# Patient Record
Sex: Female | Born: 1985
Health system: Southern US, Community
[De-identification: ages and names within clinical notes are randomized; demographics above are authoritative.]

## PROBLEM LIST (undated history)

## (undated) DIAGNOSIS — G43009 Migraine without aura, not intractable, without status migrainosus: Secondary | ICD-10-CM

## (undated) DIAGNOSIS — E119 Type 2 diabetes mellitus without complications: Secondary | ICD-10-CM

## (undated) DIAGNOSIS — A749 Chlamydial infection, unspecified: Secondary | ICD-10-CM

## (undated) DIAGNOSIS — J45909 Unspecified asthma, uncomplicated: Secondary | ICD-10-CM

## (undated) DIAGNOSIS — G43109 Migraine with aura, not intractable, without status migrainosus: Secondary | ICD-10-CM

## (undated) DIAGNOSIS — Z803 Family history of malignant neoplasm of breast: Secondary | ICD-10-CM

## (undated) DIAGNOSIS — J302 Other seasonal allergic rhinitis: Secondary | ICD-10-CM

## (undated) HISTORY — PX: OTHER SURGICAL HISTORY: SHX169

## (undated) HISTORY — DX: Migraine without aura, not intractable, without status migrainosus: G43.009

## (undated) HISTORY — DX: Migraine with aura, not intractable, without status migrainosus: G43.109

## (undated) HISTORY — DX: Chlamydial infection, unspecified: A74.9

## (undated) HISTORY — DX: Family history of malignant neoplasm of breast: Z80.3

---

## 2005-06-15 ENCOUNTER — Emergency Department: Payer: Self-pay | Admitting: Emergency Medicine

## 2005-09-24 ENCOUNTER — Emergency Department: Payer: Self-pay | Admitting: Internal Medicine

## 2005-09-26 ENCOUNTER — Ambulatory Visit: Payer: Self-pay | Admitting: Internal Medicine

## 2009-11-20 ENCOUNTER — Emergency Department: Payer: Self-pay | Admitting: Emergency Medicine

## 2010-03-19 ENCOUNTER — Inpatient Hospital Stay: Payer: Self-pay

## 2010-04-16 ENCOUNTER — Ambulatory Visit: Payer: Self-pay | Admitting: Family Medicine

## 2010-06-04 ENCOUNTER — Ambulatory Visit: Payer: Self-pay | Admitting: Internal Medicine

## 2011-11-04 ENCOUNTER — Emergency Department: Payer: Self-pay | Admitting: Emergency Medicine

## 2012-01-18 ENCOUNTER — Emergency Department: Payer: Self-pay | Admitting: Emergency Medicine

## 2012-05-03 ENCOUNTER — Observation Stay: Payer: Self-pay | Admitting: Internal Medicine

## 2012-05-03 LAB — COMPREHENSIVE METABOLIC PANEL
Alkaline Phosphatase: 113 U/L (ref 50–136)
Anion Gap: 12 (ref 7–16)
Bilirubin,Total: 0.5 mg/dL (ref 0.2–1.0)
Calcium, Total: 9.7 mg/dL (ref 8.5–10.1)
Chloride: 106 mmol/L (ref 98–107)
Creatinine: 0.97 mg/dL (ref 0.60–1.30)
EGFR (African American): >60
EGFR (Non-African Amer.): >60
Glucose: 180 mg/dL — ABNORMAL HIGH (ref 65–99)
Osmolality: 282 (ref 275–301)
Potassium: 3.8 mmol/L (ref 3.5–5.1)
SGOT(AST): 19 U/L (ref 15–37)
Total Protein: 8 g/dL (ref 6.4–8.2)

## 2012-05-03 LAB — CBC
HCT: 50.4 % — ABNORMAL HIGH (ref 35.0–47.0)
HGB: 16.9 g/dL — ABNORMAL HIGH (ref 12.0–16.0)
MCH: 28 pg (ref 26.0–34.0)
MCHC: 33.5 g/dL (ref 32.0–36.0)
MCV: 83 fL (ref 80–100)
Platelet: 501 10*3/uL — ABNORMAL HIGH (ref 150–440)
RBC: 6.04 10*6/uL — ABNORMAL HIGH (ref 3.80–5.20)
RDW: 14.3 % (ref 11.5–14.5)

## 2012-05-04 LAB — CBC WITH DIFFERENTIAL/PLATELET
Basophil #: 0.1 10*3/uL (ref 0.0–0.1)
Basophil %: 0.7 %
HCT: 40.2 % (ref 35.0–47.0)
MCH: 28.4 pg (ref 26.0–34.0)
MCHC: 34.2 g/dL (ref 32.0–36.0)
MCV: 83 fL (ref 80–100)
Monocyte %: 0.5 %
Neutrophil #: 8.7 10*3/uL — ABNORMAL HIGH (ref 1.4–6.5)
Neutrophil %: 89.6 %
Platelet: 292 10*3/uL (ref 150–440)
RBC: 4.85 10*6/uL (ref 3.80–5.20)
WBC: 9.7 10*3/uL (ref 3.6–11.0)

## 2012-05-04 LAB — BASIC METABOLIC PANEL
BUN: 13 mg/dL (ref 7–18)
Calcium, Total: 8.6 mg/dL (ref 8.5–10.1)
Chloride: 110 mmol/L — ABNORMAL HIGH (ref 98–107)
Co2: 21 mmol/L (ref 21–32)
Creatinine: 0.86 mg/dL (ref 0.60–1.30)
Osmolality: 280 (ref 275–301)
Potassium: 4.4 mmol/L (ref 3.5–5.1)
Sodium: 139 mmol/L (ref 136–145)

## 2012-05-04 LAB — CK-MB: CK-MB: <0.5 ng/mL — ABNORMAL LOW (ref 0.5–3.6)

## 2012-05-04 LAB — TROPONIN I: Troponin-I: <0.02 ng/mL

## 2012-06-23 ENCOUNTER — Emergency Department: Payer: Self-pay | Admitting: Emergency Medicine

## 2012-06-23 LAB — CBC
HCT: 47.1 % — ABNORMAL HIGH (ref 35.0–47.0)
MCH: 28.9 pg (ref 26.0–34.0)
MCHC: 34.6 g/dL (ref 32.0–36.0)
MCV: 84 fL (ref 80–100)
Platelet: 434 10*3/uL (ref 150–440)
RBC: 5.64 10*6/uL — ABNORMAL HIGH (ref 3.80–5.20)
RDW: 13.6 % (ref 11.5–14.5)

## 2012-06-23 LAB — COMPREHENSIVE METABOLIC PANEL
Albumin: 3.9 g/dL (ref 3.4–5.0)
Alkaline Phosphatase: 134 U/L (ref 50–136)
BUN: 14 mg/dL (ref 7–18)
Bilirubin,Total: 0.5 mg/dL (ref 0.2–1.0)
Chloride: 105 mmol/L (ref 98–107)
Co2: 28 mmol/L (ref 21–32)
Creatinine: 1.06 mg/dL (ref 0.60–1.30)
Glucose: 145 mg/dL — ABNORMAL HIGH (ref 65–99)
SGOT(AST): 22 U/L (ref 15–37)
Sodium: 139 mmol/L (ref 136–145)

## 2012-06-23 LAB — URINALYSIS, COMPLETE
Bilirubin,UR: NEGATIVE
Blood: NEGATIVE
Ketone: NEGATIVE
RBC,UR: 1 /HPF (ref 0–5)
Squamous Epithelial: 4
WBC UR: 3 /HPF (ref 0–5)

## 2012-06-23 LAB — LIPASE, BLOOD: Lipase: 226 U/L (ref 73–393)

## 2013-11-15 ENCOUNTER — Emergency Department: Payer: Self-pay | Admitting: Emergency Medicine

## 2014-01-17 ENCOUNTER — Ambulatory Visit: Payer: Self-pay | Admitting: Emergency Medicine

## 2014-07-02 NOTE — Discharge Summary (Signed)
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  She had allergies to cat dander in the past and then other 2 times she came in with allergic reactions, which were very, very mild with different substances.  She had an episode of migraine type of headache. She took Excedrin and Zofran. She also visited some store for farm products and later on she started having some itching on her skin and she took Children's Benadryl, but the itching worsened and so she came to the Emergency Room. In the ER, she was found having hypotensive and tachycardic but she was fully alert and oriented. She received Amanda [name] dose of Solu-Medrol and epinephrine by the ER and admitted for further work-up and observation for anaphylactic reaction.   HOSPITAL COURSE AND STAY:   She remained stable. She was continued on IV fluid.  Blood pressure, respirations and her symptoms remained totally stable. She did not have any further episodes of allergy reactions.  The next day morning she was feeling perfectly fine, back to her baseline and I asked her to have an EpiPen with her. She said that she already was given Amanda [name] prescription by her allergy specialist and she had an appointment by allergist specialist within Amanda [name] few weeks to get Amanda [name] full allergy work-up done. She is not filling up the prescription of epinephrine as there is Amanda [name] lot of co-pay for that, and so she manages her allergies with Benadryl orally.  As she was feeling perfectly fine, we discharged her home with advice to follow up with her allergy specialist doctor.  Other medical issues:  Elevated blood sugar on admission, but we checked her hemoglobin A1c which  was 4.6 so that might be due to injection of steroid or as she claims that she ate Amanda [name] lot of ice cream and chocolate on that day and may be that effect of that.    Nonspecific EKG changes.  Troponin was normal and there was no chest pain, so we did not do any further work-up about this and discharged her home.  LABORATORY AND DIAGNOSTIC DATA:  All of them are within normal range. No any significant abnormal lab results.   CONDITION ON DISCHARGE: Stable.   CODE STATUS: Full code.  MEDICATIONS ON DISCHARGE:  Ventolin 2 puffs inhaled every 6 hours as needed for shortness of breath and wheezing, phentermine 37.5 mg oral capsule once Amanda [name] day in the morning, Imitrex 50 mg oral tablet once Amanda [name] day as needed for migraine, ondansetron 8 mg oral tablet every 8 hours as needed for nausea and then Benaphen 50 mg oral infection. 3 times Amanda [name] day as needed for itching.   HOME HEALTH ON DISCHARGE: No.   HOME OXYGEN:  No.   DIET ON DISCHARGE: Regular. Diet consistency regular.   ACTIVITY LIMITATION:  As tolerated.   FOLLOW UP:  Timeframe to follow-up within 1 to 2 weeks with her allergy physician and follow his recommendations.   Total time spent on this discharge: 40 minutes     ____________________________ Hope PigeonVaibhavkumar G. Elisabeth PigeonVachhani, MD vgv:ct D: 05/05/2012 19:24:00 ET T: 05/06/2012 09:31:22 ET JOB#: [id number]  cc: Hope PigeonVaibhavkumar G. Elisabeth PigeonVachhani, MD, <Dictator> Altamese DillingVAIBHAVKUMAR Tahja Liao MD ELECTRONICALLY SIGNED  05/11/2012 23:05 

## 2014-07-02 NOTE — H&P (Signed)
PATIENT NAME:  Amanda [name], Amanda [name] MR#:  [id number] DATE OF BIRTH:  February 01, 1986  DATE OF ADMISSION:  05/03/2012  PRIMARY CARE PROVIDER:  [facility].   EMERGENCY DEPARTMENT REFERRING PHYSICIAN:  Dr. Maricela Bo.   CHIEF COMPLAINT:  Possible anaphylactic shock.   HISTORY OF PRESENT ILLNESS:  The patient is a 29 year old white female who reports that she has had a history of having mild allergic reactions in the past.  She reports the first time she had to come to the ED for cat dander allergic reaction with rash and itching, then she had to come 2 other times for other allergic reactions which were very mild.  She has been seen by an allergist and is planned to have a skin testing done.  The patient earlier today had a migraine.  So she took at 9:00 a.m. Excedrin Migraine.  Then, one hour later she was nauseous so she took Zofran.  She has taken both of these medications in the past without any problems.  And subsequently she went out to a store and when she was returning back she developed acute itching all over her body.  And so she came home and took Children's Benadryl.  She states that she took about 8 teaspoons and developed hives throughout what her body.  So came to the ED.  In the ER she was hypotensive and tachycardic.  Had to receive a dose of epinephrine and has received a dose of Solu-Medrol.  I am asked to admit the patient to monitor her overnight for further allergic reaction.  The patient reports that the itching is mostly resolved and the hives are gone.  She feels better.  She denies any chest pain or shortness of breath.  Denies any abdominal pain, nausea, vomiting or diarrhea.   PAST MEDICAL HISTORY:  History of allergies to she believes environmental allergies.  She also has a history of asthma for which she uses inhalers as needed.   PAST SURGICAL HISTORY:  History of having surgery in her ear as a child and a lymph node removal.   ALLERGIES:  CECLOR.    MEDICATIONS:  She is on Imitrex daily as needed for headache, Zofran 8 mg q. 8 as needed  for nausea, vomiting, phentermine 37.5 q. morning, Ventolin as needed.   SOCIAL HISTORY:  Does not smoke.  Does not drink.  No drugs.   FAMILY HISTORY:  Diabetes in uncle and grandparents.   REVIEW OF SYSTEMS: CONSTITUTIONAL:  Denies any fevers, chills.  No weakness.  No pain.  No weight loss.  No weight gain.  EYES:  No blurred or double vision.  No pain.  No redness.  No inflammation.  No glaucoma.  No cataracts.  EARS, NOSE, THROAT:  No tinnitus.  No ear pain.  No hearing loss.  Does have seasonal and year-round allergies.  No epistaxis.  No discharge.  No snoring.  No difficulty swallowing.  RESPIRATORY:  No cough, wheezing.  No hemoptysis.  No COPD.  No tuberculosis.  CARDIOVASCULAR:  Denies any chest pain, orthopnea or edema.  GASTROINTESTINAL:  No nausea, vomiting, diarrhea.  No abdominal pain.  No hematemesis.  No melena.  No changes in bowel habits.  GENITOURINARY:  Denies any dysuria, hematuria, renal calculus or frequency.  ENDOCRINE:  Denies any polyuria, nocturia or thyroid problems.  HEMATOLOGY AND LYMPHATIC:  Denies anemia, easy bruisability or bleeding.  SKIN:  No acne.  No rash.  No changes in mole, hair or  skin.  MUSCULOSKELETAL:  Denies any pain in neck, back or shoulder.  NEUROLOGIC:  No numbness.  No CVA.  No TIA.  No seizures.  PSYCHIATRIC:  No anxiety.  No insomnia.  No ADD.  No OCD.   PHYSICAL EXAMINATION: VITAL SIGNS:  Temperature 97.8, pulse initially was 126, respirations were 24, blood pressure was 68/48, currently heart rate is 91, respirations 20, blood pressure 123/67, O2 100%.  GENERAL:  The patient is an obese female currently not in any acute distress.  HEENT:  Head atraumatic, normocephalic.  Pupils equally round, reactive to light and accommodation.  There is no conjunctival pallor.  No scleral icterus.  Nasal exam shows no drainage or ulceration.   Oropharynx is  clear without any exudate.  NECK:  No thyromegaly.  No carotid bruits.  CARDIOVASCULAR:  Regular rate and rhythm.  No murmurs, rubs, clicks or gallops.  PMI is not displaced.  ABDOMEN:  Soft, nontender, nondistended.  Positive bowel sounds x 4.  EXTREMITIES:  No clubbing, cyanosis, edema.  SKIN:  No rash.  LYMPHATICS:  No lymph nodes palpable.  VASCULAR:  Good DP, PT pulses.  PSYCHIATRIC:  Not anxious or depressed.  NEUROLOGICAL:  Awake, alert, oriented x 3.  No focal deficits.   LABORATORY DATA:  BMP, glucose 180, BUN 13, creatinine 0.97, sodium 139, potassium 3.8, chloride 106, CO2 is 21, calcium is 9.7.  LFTs are normal.  WBC 11.7, hemoglobin 16.9, platelet count 501.  EKG shows Q-waves noted in inferior and nonspecific ST-T wave changes were noted.  ASSESSMENT AND PLAN:  The patient is a 29 year old white female with history of previous mild allergic reaction, also has asthma, presents with hives, hypotension.  1.  Anaphylactic shock, likely due to allergic reaction, possibly to the Excedrin or due to the Zofran.  At this time we will give her IV fluids.  Monitor her on telemetry.  Give her IV Solu-Medrol.  We will give her H2-blocker and Benadryl as needed.  2.  Elevated blood sugar.  We will check a hemoglobin A1c.  The patient may be diabetic.  3.  Nonspecific EKG changes.  We will check two sets of cardiac enzymes.     TIME SPENT:  35 minutes spent.     ____________________________ Lacie ScottsShreyang H. Allena KatzPatel, MD shp:ea D: 05/03/2012 21:34:18 ET T: 05/04/2012 00:38:21 ET JOB#: [id number]  cc: Toren Tucholski H. Allena KatzPatel, MD, <Dictator> Charise CarwinSHREYANG H Calvyn Kurtzman MD ELECTRONICALLY SIGNED 05/05/2012 12:25

## 2015-01-28 ENCOUNTER — Encounter: Payer: Self-pay | Admitting: Emergency Medicine

## 2015-01-28 ENCOUNTER — Ambulatory Visit
Admission: EM | Admit: 2015-01-28 | Discharge: 2015-01-28 | Disposition: A | Discharge status: Home / Self Care | Payer: BLUE CROSS/BLUE SHIELD | Attending: Family Medicine | Admitting: Family Medicine

## 2015-01-28 DIAGNOSIS — J069 Acute upper respiratory infection, unspecified: Secondary | ICD-10-CM | POA: Diagnosis not present

## 2015-01-28 HISTORY — DX: Unspecified asthma, uncomplicated: J45.909

## 2015-01-28 HISTORY — DX: Other seasonal allergic rhinitis: J30.2

## 2015-01-28 NOTE — ED Notes (Signed)
Patient c/o sore throat, nasal congestion, sinus pain and pressure, HAs, cough and chest congestion since Sunday.  Patient denies fevers.

## 2015-01-28 NOTE — Discharge Instructions (Signed)
Rest,  Increase fluids Tylenol or Ibuprofen as directed Mucinex as directed Upper Respiratory Infection, Adult Most upper respiratory infections (URIs) are a viral infection of the air passages leading to the lungs. A URI affects the nose, throat, and upper air passages. The most common type of URI is nasopharyngitis and is typically referred to as "the common cold." URIs run their course and usually go away on their own. Most of the time, a URI does not require medical attention, but sometimes a bacterial infection in the upper airways can follow a viral infection. This is called a secondary infection. Sinus and middle ear infections are common types of secondary upper respiratory infections. Bacterial pneumonia can also complicate a URI. A URI can worsen asthma and chronic obstructive pulmonary disease (COPD). Sometimes, these complications can require emergency medical care and may be life threatening.  CAUSES Almost all URIs are caused by viruses. A virus is a type of germ and can spread from one person to another.  RISKS FACTORS You may be at risk for a URI if:   You smoke.   You have chronic heart or lung disease.  You have a weakened defense (immune) system.   You are very young or very old.   You have nasal allergies or asthma.  You work in crowded or poorly ventilated areas.  You work in health care facilities or schools. SIGNS AND SYMPTOMS  Symptoms typically develop 2-3 days after you come in contact with a cold virus. Most viral URIs last 7-10 days. However, viral URIs from the influenza virus (flu virus) can last 14-18 days and are typically more severe. Symptoms may include:   Runny or stuffy (congested) nose.   Sneezing.   Cough.   Sore throat.   Headache.   Fatigue.   Fever.   Loss of appetite.   Pain in your forehead, behind your eyes, and over your cheekbones (sinus pain).  Muscle aches.  DIAGNOSIS  Your health care provider may diagnose a  URI by:  Physical exam.  Tests to check that your symptoms are not due to another condition such as:  Strep throat.  Sinusitis.  Pneumonia.  Asthma. TREATMENT  A URI goes away on its own with time. It cannot be cured with medicines, but medicines may be prescribed or recommended to relieve symptoms. Medicines may help:  Reduce your fever.  Reduce your cough.  Relieve nasal congestion. HOME CARE INSTRUCTIONS   Take medicines only as directed by your health care provider.   Gargle warm saltwater or take cough drops to comfort your throat as directed by your health care provider.  Use a warm mist humidifier or inhale steam from a shower to increase air moisture. This may make it easier to breathe.  Drink enough fluid to keep your urine clear or pale yellow.   Eat soups and other clear broths and maintain good nutrition.   Rest as needed.   Return to work when your temperature has returned to normal or as your health care provider advises. You may need to stay home longer to avoid infecting others. You can also use a face mask and careful hand washing to prevent spread of the virus.  Increase the usage of your inhaler if you have asthma.   Do not use any tobacco products, including cigarettes, chewing tobacco, or electronic cigarettes. If you need help quitting, ask your health care provider. PREVENTION  The best way to protect yourself from getting a cold is to practice good  hygiene.   Avoid oral or hand contact with people with cold symptoms.   Wash your hands often if contact occurs.  There is no clear evidence that vitamin C, vitamin E, echinacea, or exercise reduces the chance of developing a cold. However, it is always recommended to get plenty of rest, exercise, and practice good nutrition.  SEEK MEDICAL CARE IF:   You are getting worse rather than better.   Your symptoms are not controlled by medicine.   You have chills.  You have worsening  shortness of breath.  You have brown or red mucus.  You have yellow or brown nasal discharge.  You have pain in your face, especially when you bend forward.  You have a fever.  You have swollen neck glands.  You have pain while swallowing.  You have white areas in the back of your throat. SEEK IMMEDIATE MEDICAL CARE IF:   You have severe or persistent:  Headache.  Ear pain.  Sinus pain.  Chest pain.  You have chronic lung disease and any of the following:  Wheezing.  Prolonged cough.  Coughing up blood.  A change in your usual mucus.  You have a stiff neck.  You have changes in your:  Vision.  Hearing.  Thinking.  Mood. MAKE SURE YOU:   Understand these instructions.  Will watch your condition.  Will get help right away if you are not doing well or get worse.   This information is not intended to replace advice given to you by your health care provider. Make sure you discuss any questions you have with your health care provider.   Document Released: 08/22/2000 Document Revised: 07/13/2014 Document Reviewed: 06/03/2013 Elsevier Interactive Patient Education [employer]2016 [employer] Inc.

## 2015-01-28 NOTE — ED Provider Notes (Signed)
CSN: [id number]     Arrival date & time 01/28/15  0749 History   First MD Initiated Contact with Patient 01/28/15 [phone]     Chief Complaint  Patient presents with  . Sore Throat  . Cough  . Nasal Congestion   HPI  Amanda [name] is a pleasant 29 y.o. female with sore throat, nasal congestion, & headache.  She is coughing up green phlegm.  Denies SOB or chest pain.  Denies ill contacts.  Denies otalgia.  No fever.  + chills. Pain 2/10 now.  She has tried mucinex, tylenol cold & sinus, honey & hot tea.  Denies rash.  Tylenol helps with pain for a few hours.  Hx "allergic asthma" but no recent flares.  She does take singulair daily.  Past Medical History  Diagnosis Date  . Seasonal allergies   . Asthma, allergic    History reviewed. No pertinent past surgical history. History reviewed. No pertinent family history. Social History  Substance Use Topics  . Smoking status: Never Smoker   . Smokeless tobacco: Never Used  . Alcohol Use: 0.0 oz/week    0 Standard drinks or equivalent per week   OB History    No data available     Review of Systems  Constitutional: Negative.   HENT: Positive for congestion, postnasal drip, rhinorrhea, sinus pressure, sneezing and sore throat.   Eyes: Negative.   Respiratory: Positive for cough. Negative for chest tightness, shortness of breath and wheezing.   Cardiovascular: Negative.   Gastrointestinal: Negative.   Endocrine: Negative.   Genitourinary: Negative.   Musculoskeletal: Negative.   Skin: Negative.   Neurological: Negative.   Hematological: Negative.   Psychiatric/Behavioral: Negative.     Allergies  Qvar and Ceclor  Home Medications   Prior to Admission medications   Medication Sig Start Date End Date Taking? Authorizing Provider  acetaminophen (TYLENOL) 500 MG tablet Take 500 mg by mouth every 6 (six) hours as needed.   Yes Historical Provider, MD  fluticasone (FLONASE) 50 MCG/ACT nasal spray Place 2 sprays into both nostrils  daily.   Yes Historical Provider, MD  montelukast (SINGULAIR) 10 MG tablet Take 10 mg by mouth at bedtime.   Yes Historical Provider, MD  topiramate (TOPAMAX) 15 MG capsule Take 15 mg by mouth 2 (two) times daily.   Yes Historical Provider, MD   Meds Ordered and Administered this Visit  Medications - No data to display  BP 111/80 mmHg  Pulse 82  Temp(Src) 98 F (36.7 C) (Tympanic)  Resp 16  Ht 5\' 5"  (1.651 m)  Wt 214 lb (97.07 kg)  BMI 35.61 kg/m2  SpO2 99%  LMP 01/14/2015 (Approximate) No data found.   Physical Exam  Constitutional: She is oriented to person, place, and time. She appears well-developed and well-nourished. No distress.  HENT:  Head: Normocephalic and atraumatic.  Right Ear: Hearing, tympanic membrane, external ear and ear canal normal.  Left Ear: Hearing, tympanic membrane, external ear and ear canal normal.  Nose: Mucosal edema and rhinorrhea present. Right sinus exhibits no maxillary sinus tenderness and no frontal sinus tenderness. Left sinus exhibits no maxillary sinus tenderness and no frontal sinus tenderness.  Mouth/Throat: Uvula is midline, oropharynx is clear and moist and mucous membranes are normal.  Eyes: Conjunctivae are normal. No scleral icterus.  Neck: Normal range of motion.  Cardiovascular: Normal rate and regular rhythm.   Pulmonary/Chest: Effort normal and breath sounds normal. No respiratory distress.  Abdominal: Soft. Bowel sounds are normal. She  exhibits no distension.  Musculoskeletal: Normal range of motion. She exhibits no edema or tenderness.  Neurological: She is alert and oriented to person, place, and time. No cranial nerve deficit.  Skin: Skin is warm and dry. No rash noted. No erythema.  Psychiatric: Her behavior is normal. Judgment normal.  Nursing note and vitals reviewed.   ED Course  Procedures NA  MDM   1. URI (upper respiratory infection)    Patient reassured this is likely viral URI & symptoms should resolve within  2-3 weeks. Continue supportive measures. No role for antibiotics at this time.  Explained to patient this is likely viral URI. Cough and symptoms may persist for up to 3 weeks at times. Continue supportive measures. Reassured and discussed warning signs for patient to return to clinic including SOB, weakness, unrelenting fever, severe pain or worsening symptoms.  Patient agrees with plan  Plan: Diagnosis reviewed with patient Ibuprofen or tylenol PRN Mucinex 1-2 tabs BID PRN Continue singulair & resume Flonase  Recommend supportive treatment with rest,  Increased fluids Seek additional medical care if symptoms worsen or are not improving     Joselyn Arrow, NP 01/28/15 0840  Joselyn Arrow, NP 01/28/15 [phone]

## 2015-06-16 DIAGNOSIS — J301 Allergic rhinitis due to pollen: Secondary | ICD-10-CM | POA: Diagnosis not present

## 2015-06-22 DIAGNOSIS — J301 Allergic rhinitis due to pollen: Secondary | ICD-10-CM | POA: Diagnosis not present

## 2015-06-23 DIAGNOSIS — J301 Allergic rhinitis due to pollen: Secondary | ICD-10-CM | POA: Diagnosis not present

## 2015-06-30 DIAGNOSIS — J301 Allergic rhinitis due to pollen: Secondary | ICD-10-CM | POA: Diagnosis not present

## 2015-07-07 DIAGNOSIS — J301 Allergic rhinitis due to pollen: Secondary | ICD-10-CM | POA: Diagnosis not present

## 2015-07-14 DIAGNOSIS — J301 Allergic rhinitis due to pollen: Secondary | ICD-10-CM | POA: Diagnosis not present

## 2015-07-21 DIAGNOSIS — J301 Allergic rhinitis due to pollen: Secondary | ICD-10-CM | POA: Diagnosis not present

## 2015-07-28 DIAGNOSIS — J301 Allergic rhinitis due to pollen: Secondary | ICD-10-CM | POA: Diagnosis not present

## 2015-08-04 DIAGNOSIS — J301 Allergic rhinitis due to pollen: Secondary | ICD-10-CM | POA: Diagnosis not present

## 2015-08-11 DIAGNOSIS — J301 Allergic rhinitis due to pollen: Secondary | ICD-10-CM | POA: Diagnosis not present

## 2015-08-18 DIAGNOSIS — J301 Allergic rhinitis due to pollen: Secondary | ICD-10-CM | POA: Diagnosis not present

## 2015-08-25 DIAGNOSIS — J301 Allergic rhinitis due to pollen: Secondary | ICD-10-CM | POA: Diagnosis not present

## 2015-09-01 DIAGNOSIS — J301 Allergic rhinitis due to pollen: Secondary | ICD-10-CM | POA: Diagnosis not present

## 2015-09-08 DIAGNOSIS — J301 Allergic rhinitis due to pollen: Secondary | ICD-10-CM | POA: Diagnosis not present

## 2015-09-14 DIAGNOSIS — J301 Allergic rhinitis due to pollen: Secondary | ICD-10-CM | POA: Diagnosis not present

## 2015-09-22 DIAGNOSIS — J301 Allergic rhinitis due to pollen: Secondary | ICD-10-CM | POA: Diagnosis not present

## 2015-09-26 DIAGNOSIS — J301 Allergic rhinitis due to pollen: Secondary | ICD-10-CM | POA: Diagnosis not present

## 2015-10-06 DIAGNOSIS — J301 Allergic rhinitis due to pollen: Secondary | ICD-10-CM | POA: Diagnosis not present

## 2015-10-13 DIAGNOSIS — J301 Allergic rhinitis due to pollen: Secondary | ICD-10-CM | POA: Diagnosis not present

## 2015-10-20 DIAGNOSIS — J301 Allergic rhinitis due to pollen: Secondary | ICD-10-CM | POA: Diagnosis not present

## 2015-10-27 DIAGNOSIS — J301 Allergic rhinitis due to pollen: Secondary | ICD-10-CM | POA: Diagnosis not present

## 2015-11-01 DIAGNOSIS — J3089 Other allergic rhinitis: Secondary | ICD-10-CM | POA: Diagnosis not present

## 2015-11-01 DIAGNOSIS — Z713 Dietary counseling and surveillance: Secondary | ICD-10-CM | POA: Diagnosis not present

## 2015-11-01 DIAGNOSIS — G43709 Chronic migraine without aura, not intractable, without status migrainosus: Secondary | ICD-10-CM | POA: Diagnosis not present

## 2015-11-03 DIAGNOSIS — J301 Allergic rhinitis due to pollen: Secondary | ICD-10-CM | POA: Diagnosis not present

## 2015-11-10 DIAGNOSIS — J301 Allergic rhinitis due to pollen: Secondary | ICD-10-CM | POA: Diagnosis not present

## 2015-11-16 DIAGNOSIS — J301 Allergic rhinitis due to pollen: Secondary | ICD-10-CM | POA: Diagnosis not present

## 2015-12-01 DIAGNOSIS — J301 Allergic rhinitis due to pollen: Secondary | ICD-10-CM | POA: Diagnosis not present

## 2015-12-09 DIAGNOSIS — J301 Allergic rhinitis due to pollen: Secondary | ICD-10-CM | POA: Diagnosis not present

## 2015-12-09 DIAGNOSIS — Z0000 Encounter for general adult medical examination without abnormal findings: Secondary | ICD-10-CM | POA: Diagnosis not present

## 2015-12-09 DIAGNOSIS — R5383 Other fatigue: Secondary | ICD-10-CM | POA: Diagnosis not present

## 2015-12-09 DIAGNOSIS — Z13220 Encounter for screening for lipoid disorders: Secondary | ICD-10-CM | POA: Diagnosis not present

## 2015-12-09 DIAGNOSIS — Z131 Encounter for screening for diabetes mellitus: Secondary | ICD-10-CM | POA: Diagnosis not present

## 2015-12-15 DIAGNOSIS — J301 Allergic rhinitis due to pollen: Secondary | ICD-10-CM | POA: Diagnosis not present

## 2015-12-22 DIAGNOSIS — J301 Allergic rhinitis due to pollen: Secondary | ICD-10-CM | POA: Diagnosis not present

## 2015-12-29 DIAGNOSIS — J301 Allergic rhinitis due to pollen: Secondary | ICD-10-CM | POA: Diagnosis not present

## 2016-01-03 DIAGNOSIS — J301 Allergic rhinitis due to pollen: Secondary | ICD-10-CM | POA: Diagnosis not present

## 2016-01-09 DIAGNOSIS — Z713 Dietary counseling and surveillance: Secondary | ICD-10-CM | POA: Diagnosis not present

## 2016-01-09 DIAGNOSIS — J301 Allergic rhinitis due to pollen: Secondary | ICD-10-CM | POA: Diagnosis not present

## 2016-01-19 DIAGNOSIS — J301 Allergic rhinitis due to pollen: Secondary | ICD-10-CM | POA: Diagnosis not present

## 2016-01-26 DIAGNOSIS — J301 Allergic rhinitis due to pollen: Secondary | ICD-10-CM | POA: Diagnosis not present

## 2016-01-27 DIAGNOSIS — J301 Allergic rhinitis due to pollen: Secondary | ICD-10-CM | POA: Diagnosis not present

## 2016-01-27 DIAGNOSIS — H7200 Central perforation of tympanic membrane, unspecified ear: Secondary | ICD-10-CM | POA: Diagnosis not present

## 2016-01-31 DIAGNOSIS — J301 Allergic rhinitis due to pollen: Secondary | ICD-10-CM | POA: Diagnosis not present

## 2016-02-16 DIAGNOSIS — J301 Allergic rhinitis due to pollen: Secondary | ICD-10-CM | POA: Diagnosis not present

## 2016-02-23 DIAGNOSIS — J301 Allergic rhinitis due to pollen: Secondary | ICD-10-CM | POA: Diagnosis not present

## 2016-03-01 DIAGNOSIS — J301 Allergic rhinitis due to pollen: Secondary | ICD-10-CM | POA: Diagnosis not present

## 2016-03-16 DIAGNOSIS — J301 Allergic rhinitis due to pollen: Secondary | ICD-10-CM | POA: Diagnosis not present

## 2016-03-19 DIAGNOSIS — J301 Allergic rhinitis due to pollen: Secondary | ICD-10-CM | POA: Diagnosis not present

## 2016-03-22 DIAGNOSIS — J301 Allergic rhinitis due to pollen: Secondary | ICD-10-CM | POA: Diagnosis not present

## 2016-04-05 DIAGNOSIS — J301 Allergic rhinitis due to pollen: Secondary | ICD-10-CM | POA: Diagnosis not present

## 2016-04-12 DIAGNOSIS — J301 Allergic rhinitis due to pollen: Secondary | ICD-10-CM | POA: Diagnosis not present

## 2016-04-19 DIAGNOSIS — J301 Allergic rhinitis due to pollen: Secondary | ICD-10-CM | POA: Diagnosis not present

## 2016-04-26 DIAGNOSIS — J301 Allergic rhinitis due to pollen: Secondary | ICD-10-CM | POA: Diagnosis not present

## 2016-05-01 DIAGNOSIS — Z124 Encounter for screening for malignant neoplasm of cervix: Secondary | ICD-10-CM | POA: Diagnosis not present

## 2016-05-01 DIAGNOSIS — Z113 Encounter for screening for infections with a predominantly sexual mode of transmission: Secondary | ICD-10-CM | POA: Diagnosis not present

## 2016-05-01 DIAGNOSIS — Z01419 Encounter for gynecological examination (general) (routine) without abnormal findings: Secondary | ICD-10-CM | POA: Diagnosis not present

## 2016-05-01 DIAGNOSIS — Z1151 Encounter for screening for human papillomavirus (HPV): Secondary | ICD-10-CM | POA: Diagnosis not present

## 2016-05-03 DIAGNOSIS — J301 Allergic rhinitis due to pollen: Secondary | ICD-10-CM | POA: Diagnosis not present

## 2016-05-10 DIAGNOSIS — J301 Allergic rhinitis due to pollen: Secondary | ICD-10-CM | POA: Diagnosis not present

## 2016-05-17 DIAGNOSIS — J301 Allergic rhinitis due to pollen: Secondary | ICD-10-CM | POA: Diagnosis not present

## 2016-05-21 ENCOUNTER — Ambulatory Visit (INDEPENDENT_AMBULATORY_CARE_PROVIDER_SITE_OTHER): Payer: BLUE CROSS/BLUE SHIELD | Admitting: Obstetrics and Gynecology

## 2016-05-21 ENCOUNTER — Encounter: Payer: Self-pay | Admitting: Obstetrics and Gynecology

## 2016-05-21 VITALS — BP 130/82 | HR 81 | Ht 65.0 in | Wt 264.0 lb

## 2016-05-21 DIAGNOSIS — Z30430 Encounter for insertion of intrauterine contraceptive device: Secondary | ICD-10-CM

## 2016-05-21 MED ORDER — PARAGARD INTRAUTERINE COPPER IU IUD: 1.0000 [IU] | INTRAUTERINE_SYSTEM | Freq: Once | INTRAUTERINE | Status: AC

## 2016-05-21 NOTE — Progress Notes (Signed)
     IUD PROCEDURE NOTE:  Amanda [name] is a 31 y.o. No obstetric history on file. here for Paragard  IUD insertion. No GYN concerns.  Last pap smear was normal. LMP 05/17/16.  BP 130/82 (Patient Position: Sitting)   Pulse 81   Ht 5\' 5"  (1.651 m)   Wt 264 lb (119.7 kg)   LMP 05/17/2016 (Exact Date)   BMI 43.93 kg/m   IUD Insertion Procedure Note Patient identified, informed consent performed, consent signed.   Discussed risks of irregular bleeding, cramping, infection, malpositioning or misplacement of the IUD outside the uterus which may require further procedure such as laparoscopy, risk of failure <1%. Time out was performed.    A bimanual exam showed the uterus to be anteverted.  Speculum placed in the vagina.  Cervix visualized.  Cleaned with Betadine x 2.  Grasped anteriorly with a single tooth tenaculum.  Uterus sounded to 7.0 cm.   IUD placed per manufacturer's recommendations.  Strings trimmed to 3 cm. Tenaculum was removed, good hemostasis noted.  Patient tolerated procedure well.   ASSESSMENT: IUD insertion  Scheduled Meds: . PARAGARD INTRAUTERINE COPPER  1 Units Intrauterine Once    Plan:  Patient was given post-procedure instructions.  She was advised to have backup contraception for one week.   Call if you are having increasing pain, cramps or bleeding or if you have a fever greater than 100.4 degrees F., shaking chills, nausea or vomiting. Patient was also asked to check IUD strings periodically and follow up in 4 weeks for IUD check.  Return in about 4 weeks (around 06/18/2016) for IUD check.  Alicia B. Copland, PA-C 05/21/2016 10:41 AM

## 2016-05-24 DIAGNOSIS — J301 Allergic rhinitis due to pollen: Secondary | ICD-10-CM | POA: Diagnosis not present

## 2016-05-30 DIAGNOSIS — J301 Allergic rhinitis due to pollen: Secondary | ICD-10-CM | POA: Diagnosis not present

## 2016-06-14 DIAGNOSIS — Z1281 Encounter for screening for malignant neoplasm of oral cavity: Secondary | ICD-10-CM | POA: Diagnosis not present

## 2016-06-14 DIAGNOSIS — K033 Pathological resorption of teeth: Secondary | ICD-10-CM | POA: Diagnosis not present

## 2016-06-14 DIAGNOSIS — K0500 Acute gingivitis, plaque induced: Secondary | ICD-10-CM | POA: Diagnosis not present

## 2016-06-14 DIAGNOSIS — J329 Chronic sinusitis, unspecified: Secondary | ICD-10-CM | POA: Diagnosis not present

## 2016-06-15 DIAGNOSIS — J301 Allergic rhinitis due to pollen: Secondary | ICD-10-CM | POA: Diagnosis not present

## 2016-06-18 ENCOUNTER — Ambulatory Visit (INDEPENDENT_AMBULATORY_CARE_PROVIDER_SITE_OTHER): Payer: BLUE CROSS/BLUE SHIELD | Admitting: Obstetrics and Gynecology

## 2016-06-18 ENCOUNTER — Encounter: Payer: Self-pay | Admitting: Obstetrics and Gynecology

## 2016-06-18 VITALS — BP 116/72 | HR 100 | Ht 65.0 in | Wt 267.0 lb

## 2016-06-18 DIAGNOSIS — Z30431 Encounter for routine checking of intrauterine contraceptive device: Secondary | ICD-10-CM | POA: Diagnosis not present

## 2016-06-18 DIAGNOSIS — J301 Allergic rhinitis due to pollen: Secondary | ICD-10-CM | POA: Diagnosis not present

## 2016-06-18 NOTE — Progress Notes (Signed)
    History of Present Illness:  Amanda [name] is a 31 y.o. that had a Paragard IUD placed approximately 1 month ago. Since that time, she deneis pelvic pain, non-menstrual bleeding, vaginal d/c. Her LMP was heavier and with increased cramping but tolerable. She is happy with the IUD so far.   Review of Systems  Constitutional: Negative for fever, malaise/fatigue and weight loss.  Gastrointestinal: Negative for blood in stool, constipation, diarrhea, nausea and vomiting.  Genitourinary: Negative for dysuria, flank pain, frequency, hematuria and urgency.  Musculoskeletal: Negative for back pain.  Skin: Negative for itching and rash.    Physical Exam:  BP 116/72 (BP Location: Left Arm, Patient Position: Sitting, Cuff Size: Large)   Pulse 100   Ht  (1.651 m)   Wt 267 lb (121.1 kg)   LMP 06/13/2016   BMI 44.43 kg/m  Body mass index is 44.43 kg/m.  Pelvic exam:  Two IUD strings present seen coming from the cervical os. EGBUS, vaginal vault and cervix: within normal limits   Assessment:  Encounter for routine checking of intrauterine contraceptive device (IUD)   IUD strings present in proper location; pt doing well  Plan: F/u if any signs of infection or can no longer feel the strings.   Alicia B. Copland, PA-C 06/18/2016 4:07 PM

## 2016-06-21 DIAGNOSIS — J301 Allergic rhinitis due to pollen: Secondary | ICD-10-CM | POA: Diagnosis not present

## 2016-06-28 DIAGNOSIS — J301 Allergic rhinitis due to pollen: Secondary | ICD-10-CM | POA: Diagnosis not present

## 2016-07-05 DIAGNOSIS — J301 Allergic rhinitis due to pollen: Secondary | ICD-10-CM | POA: Diagnosis not present

## 2016-07-12 DIAGNOSIS — J301 Allergic rhinitis due to pollen: Secondary | ICD-10-CM | POA: Diagnosis not present

## 2016-07-16 DIAGNOSIS — Z79899 Other long term (current) drug therapy: Secondary | ICD-10-CM | POA: Diagnosis not present

## 2016-07-16 DIAGNOSIS — J45909 Unspecified asthma, uncomplicated: Secondary | ICD-10-CM | POA: Insufficient documentation

## 2016-07-16 DIAGNOSIS — L299 Pruritus, unspecified: Secondary | ICD-10-CM | POA: Diagnosis present

## 2016-07-16 DIAGNOSIS — T7840XA Allergy, unspecified, initial encounter: Secondary | ICD-10-CM | POA: Diagnosis not present

## 2016-07-16 MED ORDER — FAMOTIDINE 20 MG PO TABS
ORAL_TABLET | ORAL | Status: AC
  Administered 2016-07-16: 20 mg via ORAL
  Filled 2016-07-16: qty 1

## 2016-07-16 MED ORDER — PREDNISONE 20 MG PO TABS
ORAL_TABLET | ORAL | Status: AC
  Administered 2016-07-16: 60 mg via ORAL
  Filled 2016-07-16: qty 3

## 2016-07-16 MED ORDER — DIPHENHYDRAMINE HCL 25 MG PO CAPS
50.0000 mg | ORAL_CAPSULE | Freq: Once | ORAL | Status: AC
  Administered 2016-07-16: 50 mg via ORAL

## 2016-07-16 MED ORDER — PREDNISONE 20 MG PO TABS
60.0000 mg | ORAL_TABLET | Freq: Once | ORAL | Status: AC
  Administered 2016-07-16: 60 mg via ORAL

## 2016-07-16 MED ORDER — FAMOTIDINE 20 MG PO TABS
20.0000 mg | ORAL_TABLET | Freq: Once | ORAL | Status: AC
  Administered 2016-07-16: 20 mg via ORAL

## 2016-07-16 MED ORDER — DIPHENHYDRAMINE HCL 25 MG PO CAPS
ORAL_CAPSULE | ORAL | Status: AC
  Administered 2016-07-16: 50 mg via ORAL
  Filled 2016-07-16: qty 2

## 2016-07-16 NOTE — ED Triage Notes (Signed)
Pt unsure of cause states "I am allergic to everything outdoors". States has itching all over, denies any shob or swelling to mouth or throat. Pt gets allergy shots weekly, unsure of cause.

## 2016-07-17 ENCOUNTER — Emergency Department
Admission: EM | Admit: 2016-07-17 | Discharge: 2016-07-17 | Disposition: A | Discharge status: Home / Self Care | Payer: BLUE CROSS/BLUE SHIELD | Attending: Emergency Medicine | Admitting: Emergency Medicine

## 2016-07-17 DIAGNOSIS — T7840XA Allergy, unspecified, initial encounter: Secondary | ICD-10-CM

## 2016-07-17 MED ORDER — EPINEPHRINE 0.3 MG/0.3ML IJ SOAJ: 0.3000 mg | Freq: Once | INTRAMUSCULAR | 0 refills | Status: AC

## 2016-07-17 NOTE — ED Provider Notes (Signed)
[facility] Emergency Department Provider Note _   First MD Initiated Contact with Patient 07/17/16 [phone]     (approximate)  I have reviewed the triage vital signs and the nursing notes.   HISTORY  Chief Complaint Allergic Reaction    HPI Amanda [name] is a 31 y.o. female with history of multiple allergic reactions to multiple allergens presents to the emergency department with acute onset of generalized itching and hives before presentation. Patient denies any difficulty swallowing or breathing. Patient denies any stiff swelling of the mouth or throat.   Past Medical History:  Diagnosis Date  . Asthma, allergic   . Seasonal allergies     There are no active problems to display for this patient.   Past Surgical History:  Procedure Laterality Date  . Tubes in ear      Prior to Admission medications   Medication Sig Start Date End Date Taking? Authorizing Provider  acetaminophen (TYLENOL) 500 MG tablet Take 500 mg by mouth every 6 (six) hours as needed.    [provider]  fluticasone (FLONASE) 50 MCG/ACT nasal spray Place 2 sprays into both nostrils daily.    [provider]  montelukast (SINGULAIR) 10 MG tablet Take 10 mg by mouth at bedtime.    [provider]  topiramate (TOPAMAX) 15 MG capsule Take 15 mg by mouth 2 (two) times daily.    [provider]  VENTOLIN HFA 108 [phone] Base) MCG/ACT inhaler  04/27/16   [provider]    Allergies Charletta Cousin tar oil  [betula alba oil]; Qvar [beclomethasone]; and Ceclor [cefaclor]  Family History  Problem Relation Age of Onset  . Diabetes Maternal Uncle   . Breast cancer Paternal Aunt   . Diabetes Paternal Aunt   . Diabetes Maternal Grandfather     Social History Social History  Substance Use Topics  . Smoking status: Never Smoker  . Smokeless tobacco: Never Used  . Alcohol use 0.0 oz/week    Review of Systems Constitutional: No fever/chills Eyes:  No visual changes. ENT: No sore throat. Cardiovascular: Denies chest pain. Respiratory: Denies shortness of breath. Gastrointestinal: No abdominal pain.  No nausea, no vomiting.  No diarrhea.  No constipation. Genitourinary: Negative for dysuria. Musculoskeletal: Negative for back pain. Integumentary: Positive for pruritic rash Neurological: Negative for headaches, focal weakness or numbness.   ____________________________________________   PHYSICAL EXAM:  VITAL SIGNS: ED Triage Vitals  Enc Vitals Group     BP 07/16/16 2331 125/83     Pulse Rate 07/16/16 2331 (!) 137     Resp 07/16/16 2331 18     Temp 07/16/16 2331 98.5 F (36.9 C)     Temp Source 07/16/16 2331 Oral     SpO2 07/16/16 2331 97 %     Weight 07/16/16 2333 260 lb (117.9 kg)     Height 07/16/16 2333 5\' 5"  (1.651 m)     Head Circumference --      Peak Flow --      Pain Score --      Pain Loc --      Pain Edu? --      Excl. in GC? --     Constitutional: Alert and oriented. Well appearing and in no acute distress. Eyes: Conjunctivae are normal. PERRL. EOMI. Head: Atraumatic. Mouth/Throat: Mucous membranes are moist.  Oropharynx non-erythematous. Neck: No stridor.  Cardiovascular: Normal rate, regular rhythm. Good peripheral circulation. Grossly normal heart sounds. Respiratory: Normal respiratory effort.  No retractions.  Lungs CTAB. Gastrointestinal: Soft and nontender. No distention.  Musculoskeletal: No lower extremity tenderness nor edema. No gross deformities of extremities. Neurologic:  Normal speech and language. No gross focal neurologic deficits are appreciated.  Skin:  Skin is warm, dry and intact. Positive for hives Psychiatric: Mood and affect are normal. Speech and behavior are normal.     Procedures   ____________________________________________   INITIAL IMPRESSION / ASSESSMENT AND PLAN / ED COURSE  Pertinent labs & imaging results that were available during my care of the patient were  reviewed by me and considered in my medical decision making (see chart for details).  Patient given Benadryl prednisone and Pepcid with complete resolution of pruritic rash.     ____________________________________________  FINAL CLINICAL IMPRESSION(S) / ED DIAGNOSES  Final diagnoses:  Allergic reaction, initial encounter     MEDICATIONS GIVEN DURING THIS VISIT:  Medications  diphenhydrAMINE (BENADRYL) capsule 50 mg (50 mg Oral Given 07/16/16 2343)  predniSONE (DELTASONE) tablet 60 mg (60 mg Oral Given 07/16/16 2344)  famotidine (PEPCID) tablet 20 mg (20 mg Oral Given 07/16/16 2344)     NEW OUTPATIENT MEDICATIONS STARTED DURING THIS VISIT:  New Prescriptions   No medications on file    Modified Medications   No medications on file    Discontinued Medications   No medications on file     Note:  This document was prepared using Dragon voice recognition software and may include unintentional dictation errors.    Amanda [name], [place] N, MD 07/17/16 [phone]

## 2016-07-17 NOTE — ED Notes (Signed)
ED Provider at bedside. 

## 2016-07-19 DIAGNOSIS — J301 Allergic rhinitis due to pollen: Secondary | ICD-10-CM | POA: Diagnosis not present

## 2016-07-19 DIAGNOSIS — J305 Allergic rhinitis due to food: Secondary | ICD-10-CM | POA: Diagnosis not present

## 2016-07-19 DIAGNOSIS — Z91018 Allergy to other foods: Secondary | ICD-10-CM | POA: Diagnosis not present

## 2016-07-26 DIAGNOSIS — J301 Allergic rhinitis due to pollen: Secondary | ICD-10-CM | POA: Diagnosis not present

## 2016-08-02 DIAGNOSIS — J301 Allergic rhinitis due to pollen: Secondary | ICD-10-CM | POA: Diagnosis not present

## 2016-08-16 DIAGNOSIS — J301 Allergic rhinitis due to pollen: Secondary | ICD-10-CM | POA: Diagnosis not present

## 2016-08-23 DIAGNOSIS — J301 Allergic rhinitis due to pollen: Secondary | ICD-10-CM | POA: Diagnosis not present

## 2016-08-28 DIAGNOSIS — J301 Allergic rhinitis due to pollen: Secondary | ICD-10-CM | POA: Diagnosis not present

## 2016-09-06 DIAGNOSIS — J301 Allergic rhinitis due to pollen: Secondary | ICD-10-CM | POA: Diagnosis not present

## 2016-09-13 DIAGNOSIS — J301 Allergic rhinitis due to pollen: Secondary | ICD-10-CM | POA: Diagnosis not present

## 2016-09-17 DIAGNOSIS — J301 Allergic rhinitis due to pollen: Secondary | ICD-10-CM | POA: Diagnosis not present

## 2016-09-18 DIAGNOSIS — J301 Allergic rhinitis due to pollen: Secondary | ICD-10-CM | POA: Diagnosis not present

## 2016-09-27 DIAGNOSIS — J301 Allergic rhinitis due to pollen: Secondary | ICD-10-CM | POA: Diagnosis not present

## 2016-10-04 DIAGNOSIS — J301 Allergic rhinitis due to pollen: Secondary | ICD-10-CM | POA: Diagnosis not present

## 2016-10-10 DIAGNOSIS — J301 Allergic rhinitis due to pollen: Secondary | ICD-10-CM | POA: Diagnosis not present

## 2016-10-18 DIAGNOSIS — J301 Allergic rhinitis due to pollen: Secondary | ICD-10-CM | POA: Diagnosis not present

## 2016-10-25 DIAGNOSIS — J301 Allergic rhinitis due to pollen: Secondary | ICD-10-CM | POA: Diagnosis not present

## 2016-11-08 DIAGNOSIS — J301 Allergic rhinitis due to pollen: Secondary | ICD-10-CM | POA: Diagnosis not present

## 2016-11-15 DIAGNOSIS — J301 Allergic rhinitis due to pollen: Secondary | ICD-10-CM | POA: Diagnosis not present

## 2016-11-22 DIAGNOSIS — J301 Allergic rhinitis due to pollen: Secondary | ICD-10-CM | POA: Diagnosis not present

## 2016-11-29 DIAGNOSIS — J301 Allergic rhinitis due to pollen: Secondary | ICD-10-CM | POA: Diagnosis not present

## 2016-12-06 DIAGNOSIS — J301 Allergic rhinitis due to pollen: Secondary | ICD-10-CM | POA: Diagnosis not present

## 2016-12-13 DIAGNOSIS — J301 Allergic rhinitis due to pollen: Secondary | ICD-10-CM | POA: Diagnosis not present

## 2016-12-20 DIAGNOSIS — J301 Allergic rhinitis due to pollen: Secondary | ICD-10-CM | POA: Diagnosis not present

## 2017-01-03 DIAGNOSIS — J301 Allergic rhinitis due to pollen: Secondary | ICD-10-CM | POA: Diagnosis not present

## 2017-01-10 DIAGNOSIS — J301 Allergic rhinitis due to pollen: Secondary | ICD-10-CM | POA: Diagnosis not present

## 2017-01-17 DIAGNOSIS — J301 Allergic rhinitis due to pollen: Secondary | ICD-10-CM | POA: Diagnosis not present

## 2017-01-24 DIAGNOSIS — J301 Allergic rhinitis due to pollen: Secondary | ICD-10-CM | POA: Diagnosis not present

## 2017-01-29 DIAGNOSIS — J301 Allergic rhinitis due to pollen: Secondary | ICD-10-CM | POA: Diagnosis not present

## 2017-02-14 DIAGNOSIS — J301 Allergic rhinitis due to pollen: Secondary | ICD-10-CM | POA: Diagnosis not present

## 2017-02-28 DIAGNOSIS — J301 Allergic rhinitis due to pollen: Secondary | ICD-10-CM | POA: Diagnosis not present

## 2017-03-06 DIAGNOSIS — J301 Allergic rhinitis due to pollen: Secondary | ICD-10-CM | POA: Diagnosis not present

## 2017-03-14 DIAGNOSIS — J301 Allergic rhinitis due to pollen: Secondary | ICD-10-CM | POA: Diagnosis not present

## 2017-03-21 DIAGNOSIS — J301 Allergic rhinitis due to pollen: Secondary | ICD-10-CM | POA: Diagnosis not present

## 2017-03-28 DIAGNOSIS — J301 Allergic rhinitis due to pollen: Secondary | ICD-10-CM | POA: Diagnosis not present

## 2017-04-04 DIAGNOSIS — J301 Allergic rhinitis due to pollen: Secondary | ICD-10-CM | POA: Diagnosis not present

## 2017-04-11 DIAGNOSIS — J301 Allergic rhinitis due to pollen: Secondary | ICD-10-CM | POA: Diagnosis not present

## 2017-04-18 DIAGNOSIS — J301 Allergic rhinitis due to pollen: Secondary | ICD-10-CM | POA: Diagnosis not present

## 2017-04-25 DIAGNOSIS — J301 Allergic rhinitis due to pollen: Secondary | ICD-10-CM | POA: Diagnosis not present

## 2017-05-02 DIAGNOSIS — J301 Allergic rhinitis due to pollen: Secondary | ICD-10-CM | POA: Diagnosis not present

## 2017-05-09 DIAGNOSIS — J301 Allergic rhinitis due to pollen: Secondary | ICD-10-CM | POA: Diagnosis not present

## 2017-05-16 DIAGNOSIS — J301 Allergic rhinitis due to pollen: Secondary | ICD-10-CM | POA: Diagnosis not present

## 2017-05-19 ENCOUNTER — Other Ambulatory Visit: Payer: Self-pay

## 2017-05-19 ENCOUNTER — Ambulatory Visit
Admission: EM | Admit: 2017-05-19 | Discharge: 2017-05-19 | Disposition: A | Discharge status: Home / Self Care | Payer: BLUE CROSS/BLUE SHIELD | Attending: Emergency Medicine | Admitting: Emergency Medicine

## 2017-05-19 DIAGNOSIS — R5383 Other fatigue: Secondary | ICD-10-CM

## 2017-05-19 DIAGNOSIS — M791 Myalgia, unspecified site: Secondary | ICD-10-CM | POA: Diagnosis not present

## 2017-05-19 DIAGNOSIS — J029 Acute pharyngitis, unspecified: Secondary | ICD-10-CM | POA: Diagnosis not present

## 2017-05-19 DIAGNOSIS — J020 Streptococcal pharyngitis: Secondary | ICD-10-CM | POA: Diagnosis not present

## 2017-05-19 LAB — RAPID STREP SCREEN (MED CTR MEBANE ONLY): STREPTOCOCCUS, GROUP A SCREEN (DIRECT): POSITIVE — AB

## 2017-05-19 LAB — RAPID INFLUENZA A&B ANTIGENS
Influenza A (ARMC): NEGATIVE
Influenza B (ARMC): NEGATIVE

## 2017-05-19 MED ORDER — AMOXICILLIN 875 MG PO TABS: 875.0000 mg | ORAL_TABLET | Freq: Two times a day (BID) | ORAL | 0 refills | Status: DC

## 2017-05-19 MED ORDER — PENICILLIN G BENZATHINE 1200000 UNIT/2ML IM SUSP: 1.2000 10*6.[IU] | Freq: Once | INTRAMUSCULAR | Status: DC

## 2017-05-19 NOTE — ED Provider Notes (Signed)
[facility]    CSN: [id number] Arrival date & time: 05/19/17  0801     History   Chief Complaint Chief Complaint  Patient presents with  . Sore Throat    HPI Amanda [name] is a 32 y.o. female.   HPI  Ms. Koerner presents with sore throat body aches and chills that she has had since Friday 2 days prior to this visit.  She also states that she had sinus congestion for a couple weeks but not nearly as bad as what has happened since Friday.  Her temperature at home has been 101.  Today it is 24 but she has taken medications prior to the visit today.  Pulse rate is 119 O2 sats are 99% on room air. She did receive her flu shot earlier in the year       Past Medical History:  Diagnosis Date  . Asthma, allergic   . Seasonal allergies     There are no active problems to display for this patient.   Past Surgical History:  Procedure Laterality Date  . Tubes in ear      OB History    Gravida Para Term Preterm AB Living   2 1 1   1 1    SAB TAB Ectopic Multiple Live Births   1       1       Home Medications    Prior to Admission medications   Medication Sig Start Date End Date Taking? Authorizing Provider  acetaminophen (TYLENOL) 500 MG tablet Take 500 mg by mouth every 6 (six) hours as needed.   Yes [provider]  fluticasone (FLONASE) 50 MCG/ACT nasal spray Place 2 sprays into both nostrils daily.   Yes [provider]  montelukast (SINGULAIR) 10 MG tablet Take 10 mg by mouth at bedtime.   Yes [provider]  topiramate (TOPAMAX) 15 MG capsule Take 15 mg by mouth 2 (two) times daily.   Yes [provider]  VENTOLIN HFA 108 (90 Base) MCG/ACT inhaler  04/27/16  Yes [provider]  amoxicillin (AMOXIL) 875 MG tablet Take 1 tablet (875 mg total) by mouth 2 (two) times daily. 05/19/17   Lutricia Feil, PA-C    Family History Family History  Problem Relation Age of Onset  . Diabetes Maternal Uncle   . Breast  cancer Paternal Aunt   . Diabetes Paternal Aunt   . Diabetes Maternal Grandfather     Social History Social History   Tobacco Use  . Smoking status: Never Smoker  . Smokeless tobacco: Never Used  Substance Use Topics  . Alcohol use: Yes    Alcohol/week: 0.0 oz  . Drug use: No     Allergies   Birch tar oil  [betula alba oil]; Qvar [beclomethasone]; and Ceclor [cefaclor]   Review of Systems Review of Systems  Constitutional: Positive for activity change, chills, fatigue and fever. Negative for appetite change.  HENT: Positive for congestion and sore throat.   Respiratory: Positive for cough.   All other systems reviewed and are negative.    Physical Exam Triage Vital Signs ED Triage Vitals  Enc Vitals Group     BP 05/19/17 0814 (!) 148/98     Pulse Rate 05/19/17 0814 (!) 119     Resp --      Temp 05/19/17 0814 99 F (37.2 C)     Temp Source 05/19/17 0814 Oral     SpO2 05/19/17 0814 99 %  Weight 05/19/17 0812 272 lb (123.4 kg)     Height 05/19/17 0812 5\' 5"  (1.651 m)     Head Circumference --      Peak Flow --      Pain Score 05/19/17 0812 8     Pain Loc --      Pain Edu? --      Excl. in GC? --    No data found.  Updated Vital Signs BP (!) 148/98 (BP Location: Left Arm)   Pulse (!) 119   Temp 99 F (37.2 C) (Oral)   Ht 5\' 5"  (1.651 m)   Wt 272 lb (123.4 kg)   LMP 05/07/2017   SpO2 99%   BMI 45.26 kg/m   Visual Acuity Right Eye Distance:   Left Eye Distance:   Bilateral Distance:    Right Eye Near:   Left Eye Near:    Bilateral Near:     Physical Exam  Constitutional: She is oriented to person, place, and time. She appears well-developed and well-nourished.  Non-toxic appearance. She does not appear ill. No distress.  HENT:  Head: Normocephalic.  Right Ear: Hearing normal.  Left Ear: Hearing and tympanic membrane normal.  Mouth/Throat: Oropharynx is clear and moist and mucous membranes are normal. No uvula swelling. No posterior  oropharyngeal edema, posterior oropharyngeal erythema or tonsillar abscesses. Tonsils are 1+ on the right. Tonsils are 1+ on the left. Tonsillar exudate.  Right TM is occluded with cerumen  Eyes: Pupils are equal, round, and reactive to light.  Neck: Normal range of motion.  Pulmonary/Chest: Effort normal and breath sounds normal.  Lymphadenopathy:    She has no cervical adenopathy.  Neurological: She is alert and oriented to person, place, and time.  Skin: Skin is warm and dry.  Psychiatric: She has a normal mood and affect. Her behavior is normal.     UC Treatments / Results  Labs (all labs ordered are listed, but only abnormal results are displayed) Labs Reviewed  RAPID STREP SCREEN (NOT AT [facility]) - Abnormal; Notable for the following components:      Result Value   Streptococcus, Group A Screen (Direct) POSITIVE (*)    All other components within normal limits  RAPID INFLUENZA A&B ANTIGENS ([facility] ONLY)    EKG  EKG Interpretation None       Radiology No results found.  Procedures Procedures (including critical care time)  Medications Ordered in UC Medications - No data to display   Initial Impression / Assessment and Plan / UC Course  I have reviewed the triage vital signs and the nursing notes.  Pertinent labs & imaging results that were available during my care of the patient were reviewed by me and considered in my medical decision making (see chart for details).     Plan: 1. Test/x-ray results and diagnosis reviewed with patient 2. rx as per orders; risks, benefits, potential side effects reviewed with patient 3. Recommend supportive treatment with ibuprofen for throat pain.  Use lozenges as necessary to control pain.  Recommend remaining out of work for 24 hours and then return. 4. F/u prn if symptoms worsen or don't improve   Final Clinical Impressions(s) / UC Diagnoses   Final diagnoses:  Strep pharyngitis    ED Discharge Orders        Ordered     amoxicillin (AMOXIL) 875 MG tablet  2 times daily     05/19/17 0853       Controlled Substance Prescriptions [place] Controlled Substance  Registry consulted? Not Applicable   Lutricia Feil, PA-C 05/19/17 7829

## 2017-05-19 NOTE — ED Triage Notes (Signed)
Patient is experiencing sore throat, body aches, chills since Friday. She states she has also had sinus congestion for a couple weeks.

## 2017-05-19 NOTE — Discharge Instructions (Addendum)
Gargle with salt water as necessary for throat pain.  Use 1/2-1 teaspoon for 8 ounces of water.  May also use lozenges.  Ibuprofen for throat pain

## 2017-05-20 DIAGNOSIS — J4520 Mild intermittent asthma, uncomplicated: Secondary | ICD-10-CM | POA: Diagnosis not present

## 2017-05-20 DIAGNOSIS — G43709 Chronic migraine without aura, not intractable, without status migrainosus: Secondary | ICD-10-CM | POA: Diagnosis not present

## 2017-05-20 DIAGNOSIS — Z713 Dietary counseling and surveillance: Secondary | ICD-10-CM | POA: Diagnosis not present

## 2017-05-23 DIAGNOSIS — J301 Allergic rhinitis due to pollen: Secondary | ICD-10-CM | POA: Diagnosis not present

## 2017-05-30 DIAGNOSIS — J301 Allergic rhinitis due to pollen: Secondary | ICD-10-CM | POA: Diagnosis not present

## 2017-06-03 DIAGNOSIS — J301 Allergic rhinitis due to pollen: Secondary | ICD-10-CM | POA: Diagnosis not present

## 2017-06-06 DIAGNOSIS — J301 Allergic rhinitis due to pollen: Secondary | ICD-10-CM | POA: Diagnosis not present

## 2017-06-13 DIAGNOSIS — J301 Allergic rhinitis due to pollen: Secondary | ICD-10-CM | POA: Diagnosis not present

## 2017-06-17 DIAGNOSIS — Z713 Dietary counseling and surveillance: Secondary | ICD-10-CM | POA: Diagnosis not present

## 2017-06-17 DIAGNOSIS — Z1329 Encounter for screening for other suspected endocrine disorder: Secondary | ICD-10-CM | POA: Diagnosis not present

## 2017-06-17 DIAGNOSIS — Z13220 Encounter for screening for lipoid disorders: Secondary | ICD-10-CM | POA: Diagnosis not present

## 2017-06-20 DIAGNOSIS — J301 Allergic rhinitis due to pollen: Secondary | ICD-10-CM | POA: Diagnosis not present

## 2017-06-27 DIAGNOSIS — J301 Allergic rhinitis due to pollen: Secondary | ICD-10-CM | POA: Diagnosis not present

## 2017-12-19 ENCOUNTER — Encounter: Payer: Self-pay | Admitting: Emergency Medicine

## 2017-12-19 ENCOUNTER — Other Ambulatory Visit: Payer: Self-pay

## 2017-12-19 ENCOUNTER — Ambulatory Visit
Admission: EM | Admit: 2017-12-19 | Discharge: 2017-12-19 | Disposition: A | Discharge status: Home / Self Care | Payer: BLUE CROSS/BLUE SHIELD | Attending: Family Medicine | Admitting: Family Medicine

## 2017-12-19 DIAGNOSIS — R6889 Other general symptoms and signs: Secondary | ICD-10-CM

## 2017-12-19 LAB — RAPID INFLUENZA A&B ANTIGENS
Influenza A (ARMC): NEGATIVE
Influenza B (ARMC): NEGATIVE

## 2017-12-19 MED ORDER — BENZONATATE 200 MG PO CAPS: ORAL_CAPSULE | ORAL | 0 refills | Status: DC

## 2017-12-19 MED ORDER — HYDROCOD POLST-CPM POLST ER 10-8 MG/5ML PO SUER: 5.0000 mL | Freq: Two times a day (BID) | ORAL | 0 refills | Status: DC

## 2017-12-19 NOTE — ED Triage Notes (Addendum)
Patient in today c/o 3 day history of cough, body aches, headaches, chills, sweats and fever of 99.6-100.6. Patient has tried OTC Mucinex DM, Vicks vapor rub, Tylenol and Ibuprofen. Patient's last dose of Ibuprofen was ~3pm today.

## 2017-12-19 NOTE — ED Provider Notes (Signed)
[facility]    CSN: [id number] Arrival date & time: 12/19/17  1715     History   Chief Complaint Chief Complaint  Patient presents with  . Cough    APPT  . Generalized Body Aches    HPI Amanda [name] is a 32 y.o. female.   HPI  32 year old female presents with a 3-day history of cough body aches headaches chills sweats and fever of 99.6-1 06.  States that the onset was very sudden.  She had her flu shot approximately 2 weeks ago.  She is tried over-the-counter medications but without any true results.  Took ibuprofen at 3 PM today which may account for her lower fever.  Her pulse rate however is 103 O2 sats are 97% respirations of 16 blood pressure 134/99.          Past Medical History:  Diagnosis Date  . Asthma, allergic   . Seasonal allergies     There are no active problems to display for this patient.   Past Surgical History:  Procedure Laterality Date  . lymph node removal    . Tubes in ear      OB History    Gravida  2   Para  1   Term  1   Preterm      AB  1   Living  1     SAB  1   TAB      Ectopic      Multiple      Live Births  1            Home Medications    Prior to Admission medications   Medication Sig Start Date End Date Taking? Authorizing Provider  acetaminophen (TYLENOL) 500 MG tablet Take 500 mg by mouth every 6 (six) hours as needed.   Yes [provider]  VENTOLIN HFA 108 (90 Base) MCG/ACT inhaler  04/27/16  Yes [provider]  benzonatate (TESSALON) 200 MG capsule Take one cap TID PRN cough 12/19/17   Lutricia Feil, PA-C  chlorpheniramine-HYDROcodone (TUSSIONEX PENNKINETIC ER) 10-8 MG/5ML SUER Take 5 mLs by mouth 2 (two) times daily. 12/19/17   Lutricia Feil, PA-C    Family History Family History  Problem Relation Age of Onset  . Diabetes Maternal Uncle   . Breast cancer Paternal Aunt   . Diabetes Paternal Aunt   . Diabetes Maternal Grandfather   . Healthy  Mother   . Other Father        accidental death (hit by a train)  . Alcoholism Father     Social History Social History   Tobacco Use  . Smoking status: Never Smoker  . Smokeless tobacco: Never Used  Substance Use Topics  . Alcohol use: Yes    Alcohol/week: 0.0 standard drinks    Comment: rarely  . Drug use: No     Allergies   Birch tar oil  [betula alba oil]; Qvar [beclomethasone]; and Ceclor [cefaclor]   Review of Systems Review of Systems  Constitutional: Positive for activity change, chills, fatigue and fever.  HENT: Positive for congestion, postnasal drip, rhinorrhea, sinus pressure, sinus pain and sore throat.   Respiratory: Positive for cough.   All other systems reviewed and are negative.    Physical Exam Triage Vital Signs ED Triage Vitals  Enc Vitals Group     BP 12/19/17 1733 (!) 134/99     Pulse Rate 12/19/17 1733 (!) 103     Resp 12/19/17  1733 16     Temp 12/19/17 1733 99.5 F (37.5 C)     Temp Source 12/19/17 1733 Oral     SpO2 12/19/17 1733 97 %     Weight 12/19/17 1732 252 lb (114.3 kg)     Height 12/19/17 1732 5\' 5"  (1.651 m)     Head Circumference --      Peak Flow --      Pain Score 12/19/17 1732 5     Pain Loc --      Pain Edu? --      Excl. in GC? --    No data found.  Updated Vital Signs BP (!) 134/99 (BP Location: Left Arm)   Pulse (!) 103   Temp 99.5 F (37.5 C) (Oral)   Resp 16   Ht 5\' 5"  (1.651 m)   Wt 252 lb (114.3 kg)   LMP 12/06/2017 (Exact Date)   SpO2 97%   BMI 41.93 kg/m   Visual Acuity Right Eye Distance:   Left Eye Distance:   Bilateral Distance:    Right Eye Near:   Left Eye Near:    Bilateral Near:     Physical Exam  Constitutional: She is oriented to person, place, and time. She appears well-developed and well-nourished. No distress.  HENT:  Head: Normocephalic.  Right Ear: External ear normal.  Left Ear: External ear normal.  Nose: Nose normal.  Mouth/Throat: Oropharynx is clear and moist. No  oropharyngeal exudate.  Eyes: Pupils are equal, round, and reactive to light. Right eye exhibits no discharge. Left eye exhibits no discharge.  Neck: Normal range of motion.  Pulmonary/Chest: Effort normal and breath sounds normal.  Musculoskeletal: Normal range of motion.  Lymphadenopathy:    She has no cervical adenopathy.  Neurological: She is alert and oriented to person, place, and time.  Skin: Skin is warm and dry. She is not diaphoretic.  Psychiatric: She has a normal mood and affect. Her behavior is normal. Judgment and thought content normal.  Nursing note and vitals reviewed.    UC Treatments / Results  Labs (all labs ordered are listed, but only abnormal results are displayed) Labs Reviewed  RAPID INFLUENZA A&B ANTIGENS ([facility] ONLY)    EKG None  Radiology No results found.  Procedures Procedures (including critical care time)  Medications Ordered in UC Medications - No data to display  Initial Impression / Assessment and Plan / UC Course  I have reviewed the triage vital signs and the nursing notes.  Pertinent labs & imaging results that were available during my care of the patient were reviewed by me and considered in my medical decision making (see chart for details).     Presents with a flulike illness.  She has negative influenza test which are unreliable; had her flu shot about 2 weeks ago so may have a lesser case.  At any rate I will provide her with  cough suppressors.  I recommended Flonase nasal spray.  I offered her Tamiflu but she prefers to try elderberry extract to treat her flu.  This will run its course in a week to 10 days.  Not improving or worsens she should follow-up with her primary care physician Final Clinical Impressions(s) / UC Diagnoses   Final diagnoses:  Flu-like symptoms     Discharge Instructions     Plenty of fluids. Rest Lots.  Tylenol or Motrin for body aches and fever.  Good luck.  It was a pleasure taking care of you  today.  ED Prescriptions    Medication Sig Dispense Auth. Provider   benzonatate (TESSALON) 200 MG capsule Take one cap TID PRN cough 30 capsule Lutricia Feil, PA-C   chlorpheniramine-HYDROcodone (TUSSIONEX PENNKINETIC ER) 10-8 MG/5ML SUER Take 5 mLs by mouth 2 (two) times daily. 115 mL Lutricia Feil, PA-C     Controlled Substance Prescriptions [place] Controlled Substance Registry consulted? Not Applicable   Lutricia Feil, PA-C 12/19/17 1831

## 2017-12-19 NOTE — Discharge Instructions (Addendum)
Plenty of fluids. Rest Lots.  Tylenol or Motrin for body aches and fever.  Good luck.  It was a pleasure taking care of you today.

## 2017-12-21 ENCOUNTER — Other Ambulatory Visit: Payer: Self-pay

## 2017-12-21 ENCOUNTER — Emergency Department
Admission: EM | Admit: 2017-12-21 | Discharge: 2017-12-22 | Disposition: A | Discharge status: Home / Self Care | Payer: BLUE CROSS/BLUE SHIELD | Attending: Emergency Medicine | Admitting: Emergency Medicine

## 2017-12-21 ENCOUNTER — Encounter: Payer: Self-pay | Admitting: Emergency Medicine

## 2017-12-21 ENCOUNTER — Emergency Department: Payer: BLUE CROSS/BLUE SHIELD

## 2017-12-21 DIAGNOSIS — J181 Lobar pneumonia, unspecified organism: Secondary | ICD-10-CM

## 2017-12-21 DIAGNOSIS — J45909 Unspecified asthma, uncomplicated: Secondary | ICD-10-CM | POA: Insufficient documentation

## 2017-12-21 DIAGNOSIS — J189 Pneumonia, unspecified organism: Secondary | ICD-10-CM | POA: Insufficient documentation

## 2017-12-21 DIAGNOSIS — R05 Cough: Secondary | ICD-10-CM | POA: Diagnosis present

## 2017-12-21 LAB — INFLUENZA PANEL BY PCR (TYPE A & B)
Influenza A By PCR: NEGATIVE
Influenza B By PCR: NEGATIVE

## 2017-12-21 MED ORDER — SODIUM CHLORIDE 0.9 % IV SOLN
1.0000 g | Freq: Once | INTRAVENOUS | Status: AC
  Administered 2017-12-21: 1 g via INTRAVENOUS
  Filled 2017-12-21: qty 10

## 2017-12-21 MED ORDER — IBUPROFEN 400 MG PO TABS
ORAL_TABLET | ORAL | Status: AC
  Administered 2017-12-21: 600 mg via ORAL
  Filled 2017-12-21: qty 2

## 2017-12-21 MED ORDER — IBUPROFEN 600 MG PO TABS
600.0000 mg | ORAL_TABLET | Freq: Once | ORAL | Status: AC
  Administered 2017-12-21: 600 mg via ORAL

## 2017-12-21 MED ORDER — DIPHENHYDRAMINE HCL 50 MG/ML IJ SOLN
25.0000 mg | Freq: Once | INTRAMUSCULAR | Status: AC
  Administered 2017-12-21: 25 mg via INTRAVENOUS
  Filled 2017-12-21: qty 1

## 2017-12-21 MED ORDER — SODIUM CHLORIDE 0.9 % IV BOLUS
1000.0000 mL | Freq: Once | INTRAVENOUS | Status: AC
  Administered 2017-12-21: 1000 mL via INTRAVENOUS

## 2017-12-21 NOTE — ED Triage Notes (Signed)
Pt to ED from home c/o cough, generalized body aches, head pain, and fevers at home x5 days.  States had flu shot around sept. 23.  States has taken IBU and tylenol at home.

## 2017-12-21 NOTE — ED Notes (Signed)
Pt  Reports  Started having chills  Coughing  Congested  Pressure  X  4  Days    Seen at urgent care  2  Days ago  Cough meds and pearles  Offered tamiflu   had neg flu test at that  Time  And  Had /virus   Pt reports symptoms are worse

## 2017-12-22 MED ORDER — PREDNISONE 50 MG PO TABS: ORAL_TABLET | ORAL | 0 refills | Status: DC

## 2017-12-22 MED ORDER — AZITHROMYCIN 250 MG PO TABS: ORAL_TABLET | ORAL | 0 refills | Status: AC

## 2017-12-22 MED ORDER — PREDNISONE 20 MG PO TABS
60.0000 mg | ORAL_TABLET | Freq: Once | ORAL | Status: AC
  Administered 2017-12-22: 60 mg via ORAL
  Filled 2017-12-22: qty 3

## 2017-12-22 NOTE — ED Provider Notes (Signed)
[facility] Emergency Department Provider Note  ____________________________________________  Time seen: Approximately 12:04 AM  I have reviewed the triage vital signs and the nursing notes.   HISTORY  Chief Complaint Generalized Body Aches and Cough    HPI Amanda [name] is a 32 y.o. female presents to the emergency department with productive cough, shortness of breath, chest tightness and fatigue for the past 5 days.  Patient reports that her symptoms originally started with viral URI like symptoms.  Patient has never been admitted in the past for community-acquired pneumonia.  No history of respiratory failure or pneumonia.  Patient does have a history of asthma and has been using her albuterol inhaler at home.    Past Medical History:  Diagnosis Date  . Asthma, allergic   . Seasonal allergies     There are no active problems to display for this patient.   Past Surgical History:  Procedure Laterality Date  . lymph node removal    . Tubes in ear      Prior to Admission medications   Medication Sig Start Date End Date Taking? Authorizing Provider  acetaminophen (TYLENOL) 500 MG tablet Take 500 mg by mouth every 6 (six) hours as needed.    [provider]  azithromycin (ZITHROMAX Z-PAK) 250 MG tablet Take 2 tablets (500 mg) on  Day 1,  followed by 1 tablet (250 mg) once daily on Days 2 through 5. 12/22/17 12/27/17  Orvil Feil, PA-C  benzonatate (TESSALON) 200 MG capsule Take one cap TID PRN cough 12/19/17   Lutricia Feil, PA-C  chlorpheniramine-HYDROcodone (TUSSIONEX PENNKINETIC ER) 10-8 MG/5ML SUER Take 5 mLs by mouth 2 (two) times daily. 12/19/17   Lutricia Feil, PA-C  predniSONE (DELTASONE) 50 MG tablet Take one 50 mg tablet once daily for the next five days. 12/22/17   Gasper Lloyd  VENTOLIN HFA 108 [phone]) MCG/ACT inhaler  04/27/16   [provider]    Allergies Charletta Cousin tar oil  [betula alba oil]; Qvar  [beclomethasone]; and Ceclor [cefaclor]  Family History  Problem Relation Age of Onset  . Diabetes Maternal Uncle   . Breast cancer Paternal Aunt   . Diabetes Paternal Aunt   . Diabetes Maternal Grandfather   . Healthy Mother   . Other Father        accidental death (hit by a train)  . Alcoholism Father     Social History Social History   Tobacco Use  . Smoking status: Never Smoker  . Smokeless tobacco: Never Used  Substance Use Topics  . Alcohol use: Yes    Alcohol/week: 0.0 standard drinks    Comment: rarely  . Drug use: No     Review of Systems  Constitutional: Patient has had fever and chills.  Eyes: No visual changes. No discharge ENT: Patient has productive cough.  Cardiovascular: no chest pain. Respiratory: Patient has SOB.  Gastrointestinal: No abdominal pain.  No nausea, no vomiting.  No diarrhea.  No constipation. Musculoskeletal: Negative for musculoskeletal pain. Skin: Negative for rash, abrasions, lacerations, ecchymosis. Neurological: Negative for headaches, focal weakness or numbness.   ____________________________________________   PHYSICAL EXAM:  VITAL SIGNS: ED Triage Vitals  Enc Vitals Group     BP 12/21/17 2050 (!) 141/80     Pulse Rate 12/21/17 2050 (!) 110     Resp 12/21/17 2050 20     Temp 12/21/17 2050 (!) 101.8 F (38.8 C)     Temp Source 12/21/17 2050  Oral     SpO2 12/21/17 2050 94 %     Weight 12/21/17 2051 251 lb 5.2 oz (114 kg)     Height 12/21/17 2051 5\' 5"  (1.651 m)     Head Circumference --      Peak Flow --      Pain Score 12/21/17 2051 8     Pain Loc --      Pain Edu? --      Excl. in GC? --      Constitutional: Alert and oriented. Well appearing and in no acute distress. Eyes: Conjunctivae are normal. PERRL. EOMI. Head: Atraumatic. ENT:      Ears: TMs are effused bilaterally.      Nose: No congestion/rhinnorhea.      Mouth/Throat: Mucous membranes are moist.  Neck: No stridor.  No cervical spine tenderness to  palpation. Cardiovascular: Normal rate, regular rhythm. Normal S1 and S2.  Good peripheral circulation. Respiratory: Normal respiratory effort without tachypnea or retractions. Lungs CTAB. Good air entry to the bases with no decreased or absent breath sounds. Gastrointestinal: Bowel sounds 4 quadrants. Soft and nontender to palpation. No guarding or rigidity. No palpable masses. No distention. No CVA tenderness. Musculoskeletal: Full range of motion to all extremities. No gross deformities appreciated. Neurologic:  Normal speech and language. No gross focal neurologic deficits are appreciated.  Skin:  Skin is warm, dry and intact. No rash noted. Psychiatric: Mood and affect are normal. Speech and behavior are normal. Patient exhibits appropriate insight and judgement.   ____________________________________________   LABS (all labs ordered are listed, but only abnormal results are displayed)  Labs Reviewed  INFLUENZA PANEL BY PCR (TYPE A & B)   ____________________________________________  EKG   ____________________________________________  RADIOLOGY I personally viewed and evaluated these images as part of my medical decision making, as well as reviewing the written report by the radiologist.  Dg Chest 2 View  Result Date: 12/21/2017 CLINICAL DATA:  Patient with cough and fever. EXAM: CHEST - 2 VIEW COMPARISON:  Chest radiograph 05/03/2012. FINDINGS: Stable cardiac and mediastinal contours. Patchy consolidation within the left lower hemithorax. No pleural effusion or pneumothorax. Regional skeleton is unremarkable. IMPRESSION: Patchy consolidation left lower hemithorax concerning for pneumonia. Followup PA and lateral chest X-ray is recommended in 3-4 weeks following trial of antibiotic therapy to ensure resolution and exclude underlying malignancy. Electronically Signed   By: Annia Belt M.D.   On: 12/21/2017 21:54     ____________________________________________    PROCEDURES  Procedure(s) performed:    Procedures    Medications  predniSONE (DELTASONE) tablet 60 mg (has no administration in time range)  ibuprofen (ADVIL,MOTRIN) tablet 600 mg (600 mg Oral Given 12/21/17 2102)  sodium chloride 0.9 % bolus 1,000 mL (1,000 mLs Intravenous New Bag/Given 12/21/17 2234)  cefTRIAXone (ROCEPHIN) 1 g in sodium chloride 0.9 % 100 mL IVPB (1 g Intravenous New Bag/Given 12/21/17 2235)  diphenhydrAMINE (BENADRYL) injection 25 mg (25 mg Intravenous Given 12/21/17 2236)     ____________________________________________   INITIAL IMPRESSION / ASSESSMENT AND PLAN / ED COURSE  Pertinent labs & imaging results that were available during my care of the patient were reviewed by me and considered in my medical decision making (see chart for details).  Review of the [place] CSRS was performed in accordance of the [facility] prior to dispensing any controlled drugs.      Assessment and plan Community-acquired pneumonia Patient presents to the emergency department with fever, shortness of breath, cough and fatigue for the  past 5 days.  Chest x-ray is concerning for left lower lobe pneumonia.  Patient was given IV Rocephin in the emergency department.  Patient was discharged with azithromycin and prednisone.  Patient was advised to return to the emergency department for new or worsening symptoms.  Vital signs are reassuring prior to discharge.     ____________________________________________  FINAL CLINICAL IMPRESSION(S) / ED DIAGNOSES  Final diagnoses:  Community acquired pneumonia of left lower lobe of lung (HCC)      NEW MEDICATIONS STARTED DURING THIS VISIT:  ED Discharge Orders         Ordered    azithromycin (ZITHROMAX Z-PAK) 250 MG tablet     12/22/17 0000    predniSONE (DELTASONE) 50 MG tablet     12/22/17 0000              This chart was dictated using voice recognition software/Dragon.  Despite best efforts to proofread, errors can occur which can change the meaning. Any change was purely unintentional.    Orvil Feil, PA-C 12/22/17 Pernell Dupre    Jene Every, MD 12/22/17 [phone]

## 2018-11-21 ENCOUNTER — Other Ambulatory Visit: Payer: Self-pay

## 2018-11-21 ENCOUNTER — Ambulatory Visit
Admission: EM | Admit: 2018-11-21 | Discharge: 2018-11-21 | Disposition: A | Discharge status: Home / Self Care | Payer: BC Managed Care – PPO

## 2018-11-21 DIAGNOSIS — G43009 Migraine without aura, not intractable, without status migrainosus: Secondary | ICD-10-CM

## 2018-11-21 MED ORDER — KETOROLAC TROMETHAMINE 60 MG/2ML IM SOLN
60.0000 mg | Freq: Once | INTRAMUSCULAR | Status: AC
  Administered 2018-11-21: 20:00:00 60 mg via INTRAMUSCULAR

## 2018-11-21 MED ORDER — DEXAMETHASONE SODIUM PHOSPHATE 10 MG/ML IJ SOLN
10.0000 mg | Freq: Once | INTRAMUSCULAR | Status: AC
  Administered 2018-11-21: 20:00:00 10 mg via INTRAMUSCULAR

## 2018-11-21 NOTE — ED Provider Notes (Signed)
[place]   Name: Amanda [name] DOB: [birth year] MRN: [id number] CSN: [id number] PCP: [facility]  Arrival date and time:  11/21/18 1935  Chief Complaint:  Headache   NOTE: Prior to seeing the patient today, I have reviewed the triage nursing documentation and vital signs. Clinical staff has updated patient's PMH/PSHx, current medication list, and drug allergies/intolerances to ensure comprehensive history available to assist in medical decision making.   History:   HPI: Amanda [name] is a 33 y.o. female who presents today with complaints of a migraine headache that began with acute onset this morning at around 0930. Patient had no preceding aura. PMH (+) for migraines and this headache is similar to previous headaches. Patient denies recently illnesses; no URI symptoms. Headache is generalized. She denies any visual changes; no floaters or scintillating scatoma. She notes photophobia. Patient had some associated nausea earlier today that abated with the use of ondansetron dose taken at 1330. Patient has prophylactic medication (Topamax) prescribed, however she states, "I am bad about taking it. I forget it". Used used her prescribed abortive intervention (sumatriptan) earlier today, however she notes that it did not improve her headache. Patient has been taking APAP and IBU today as well; last APAP dose was at 1630. Patient denies any weakness in her extremities or changes to her speech.   Past Medical History:  Diagnosis Date  . Asthma, allergic   . Seasonal allergies     Past Surgical History:  Procedure Laterality Date  . lymph node removal    . Tubes in ear      Family History  Problem Relation Age of Onset  . Diabetes Maternal Uncle   . Breast cancer Paternal Aunt   . Diabetes Paternal Aunt   . Diabetes Maternal Grandfather   . Healthy Mother   . Other Father        accidental death (hit by a train)  . Alcoholism Father     Social History    Tobacco Use  . Smoking status: Never Smoker  . Smokeless tobacco: Never Used  Substance Use Topics  . Alcohol use: Yes    Alcohol/week: 0.0 standard drinks    Comment: rarely  . Drug use: No    There are no active problems to display for this patient.   Home Medications:    Current Facility-Administered Medications for the 11/21/18 encounter [facility] Encounter)  Medication  . PARAGARD INTRAUTERINE COPPER IUD 1 Units   Current Meds  Medication Sig  . acetaminophen (TYLENOL) 500 MG tablet Take 500 mg by mouth every 6 (six) hours as needed.  . SUMAtriptan (IMITREX) 50 MG tablet Take 1 tablet by mouth at the onset of your headache, may repeat in 2 hours.  . VENTOLIN HFA 108 (90 Base) MCG/ACT inhaler     Allergies:   Wendee Copp tar oil  [betula alba oil], Qvar [beclomethasone], and Ceclor [cefaclor]  Review of Systems (ROS): Review of Systems  Constitutional: Negative for chills and fever.  HENT: Negative for congestion, rhinorrhea, sinus pressure, sinus pain and sore throat.   Respiratory: Negative for cough and shortness of breath.   Cardiovascular: Negative for chest pain and palpitations.  Gastrointestinal: Positive for nausea. Negative for abdominal pain, diarrhea and vomiting.  Musculoskeletal: Negative for back pain, myalgias and neck pain.  Neurological: Positive for headaches. Negative for dizziness, syncope, speech difficulty, weakness and numbness.  All other systems reviewed and are negative.    Vital Signs: Today's Vitals   11/21/18  1942 11/21/18 1943 11/21/18 2021 11/21/18 2027  BP:  (!) 148/109    Pulse:  99    Resp:  18    Temp:  97.9 F (36.6 C)    TempSrc:  Oral    SpO2:  99%    Weight: 279 lb 15.8 oz (127 kg)     Height: 5\' 5"  (1.651 m)     PainSc: 8   2  2      Physical Exam: Physical Exam  Constitutional: She is oriented to person, place, and time and well-developed, well-nourished, and in no distress.  HENT:  Head: Normocephalic and atraumatic.   Right Ear: Tympanic membrane normal.  Left Ear: Tympanic membrane normal.  Nose: Nose normal.  Mouth/Throat: Uvula is midline, oropharynx is clear and moist and mucous membranes are normal.  Eyes: Pupils are equal, round, and reactive to light. EOM are normal.  Neck: Normal range of motion and full passive range of motion without pain. Neck supple. No spinous process tenderness and no muscular tenderness present. No tracheal deviation present.  Cardiovascular: Normal rate, regular rhythm, normal heart sounds and intact distal pulses. Exam reveals no gallop and no friction rub.  No murmur heard. Pulmonary/Chest: Effort normal and breath sounds normal. No respiratory distress. She has no wheezes. She has no rales.  Neurological: She is alert and oriented to person, place, and time. She has normal sensation, normal strength, normal reflexes and intact cranial nerves. Gait normal. GCS score is 15.  Skin: Skin is warm and dry. No rash noted.  Psychiatric: Mood, memory, affect and judgment normal.  Nursing note and vitals reviewed.   Urgent Care Treatments / Results:   LABS: PLEASE NOTE: all labs that were ordered this encounter are listed, however only abnormal results are displayed. Labs Reviewed - No data to display  EKG: -None  RADIOLOGY: No results found.  PROCEDURES: Procedures  MEDICATIONS RECEIVED THIS VISIT: Medications  ketorolac (TORADOL) injection 60 mg (60 mg Intramuscular Given 11/21/18 1959)  dexamethasone (DECADRON) injection 10 mg (10 mg Intramuscular Given 11/21/18 1957)    PERTINENT CLINICAL COURSE NOTES/UPDATES:   Initial Impression / Assessment and Plan / Urgent Care Course:  Pertinent labs & imaging results that were available during my care of the patient were personally reviewed by me and considered in my medical decision making (see lab/imaging section of note for values and interpretations).  Amanda [name] is a 33 y.o. female who presents to [facility] today with complaints of Headache   Patient is well appearing overall in clinic today. She does not appear to be in any acute distress. Presenting symptoms (see HPI) and exam as documented above. Presents with symptoms consistent with typical migraine. No recent infections. No focal neurological symptoms. She has tried to treat migraine headache at home with prescribed abortive medication (sumatriptan), APAP, and IBU, however none of these interventions were effective. Pain reduced from 8/10 to 2/10 with clinic administered interventions. Patient encouraged to increase fluid intake and rest. May use APAP and IBU at home for recurrent pain. She was encouraged to be compliant with prescribed prophylactic therapy (Topamax).   Discussed follow up with primary care physician in 1 week for re-evaluation. I have reviewed the follow up and strict return precautions for any new or worsening symptoms. Patient is aware of symptoms that would be deemed urgent/emergent, and would thus require further evaluation either here or in the emergency department. At the time of discharge, she verbalized understanding and consent with the  discharge plan as it was reviewed with her. All questions were fielded by provider and/or clinic staff prior to patient discharge.    Final Clinical Impressions / Urgent Care Diagnoses:   Final diagnoses:  Migraine without aura and without status migrainosus, not intractable    New Prescriptions:  [place] Controlled Substance Registry consulted? Not Applicable  Meds ordered this encounter  Medications  . ketorolac (TORADOL) injection 60 mg  . dexamethasone (DECADRON) injection 10 mg    Recommended Follow up Care:  Patient encouraged to follow up with the following provider within the specified time frame, or sooner as dictated by the severity of her symptoms. As always, she was instructed that for any urgent/emergent care needs, she should seek care either here or in the emergency  department for more immediate evaluation.  Follow-up Information    [place] In 1 week.   Why: General reassessment of symptoms if not improving Contact information: [address] [place] [address] [place] [id number] [phone]         NOTE: This note was prepared using Dragon dictation software along with smaller phrase technology. Despite my best ability to proofread, there is the potential that transcriptional errors may still occur from this process, and are completely unintentional.    Verlee MonteGray, Kash Davie E, NP 11/21/18 2049

## 2018-11-21 NOTE — Discharge Instructions (Addendum)
It was very nice seeing you today in clinic. Thank you for entrusting me with your care.   Increase fluid intake. REST. Continue Tylenol and Ibuprofen as needed for recurrent pain.   Make arrangements to follow up with your regular doctor for re-evaluation if not improving. If your symptoms/condition worsens, please seek follow up care either here or in the ER. Please remember, our [place] providers are "right here with you" when you need [place].   Again, it was my pleasure to take care of you today. Thank you for choosing our clinic. I hope that you start to feel better quickly.   Honor Loh, MSN, APRN, FNP-C, CEN Advanced Practice Provider [facility]

## 2018-11-21 NOTE — ED Triage Notes (Signed)
Patient complains of migraine since 930am. Patient states that she has taken multiple doses of sumatriptan today as well as tylenol and ibuprofen. Last dosage was 430pm.

## 2019-04-19 NOTE — Progress Notes (Deleted)
PCP:  [facility]   No chief complaint on file.    HPI:      Ms. Ladashia Demarinis is a 34 y.o. G2P1011 who LMP was No LMP recorded. (Menstrual status: IUD)., presents today for her annual examination.  Her menses are {norm/abn:715}, lasting {number:22536} days.  Dysmenorrhea {dysmen:716}. She {does:18564} have intermenstrual bleeding.  Sex activity: {sex active:315163}. Paragard placed 05/21/16  Last Pap: {VOJJ:009381829}  Results were: {norm/abn:16707::"no abnormalities"} /neg HPV DNA *** Hx of STDs: {STD hx:14358}  Last mammogram: {date:304500300}  Results were: {norm/abn:13465} There is no FH of breast cancer. There is no FH of ovarian cancer. The patient {does:18564} do self-breast exams.  Tobacco use: {tob:20664} Alcohol use: {Alcohol:11675} No drug use.  Exercise: {exercise:31265}  She {does:18564} get adequate calcium and Vitamin D in her diet.   There are no problems to display for this patient.   Past Surgical History:  Procedure Laterality Date  . lymph node removal    . Tubes in ear      Family History  Problem Relation Age of Onset  . Diabetes Maternal Uncle   . Breast cancer Paternal Aunt   . Diabetes Paternal Aunt   . Diabetes Maternal Grandfather   . Healthy Mother   . Other Father        accidental death (hit by a train)  . Alcoholism Father     Social History   Socioeconomic History  . Marital status: Single    Spouse name: Not on file  . Number of children: Not on file  . Years of education: Not on file  . Highest education level: Not on file  Occupational History  . Not on file  Tobacco Use  . Smoking status: Never Smoker  . Smokeless tobacco: Never Used  Substance and Sexual Activity  . Alcohol use: Yes    Alcohol/week: 0.0 standard drinks    Comment: rarely  . Drug use: No  . Sexual activity: Yes    Birth control/protection: I.U.D.  Other Topics Concern  . Not on file  Social History Narrative   Lives with her  daughter   Social Determinants of Health   Financial Resource Strain:   . Difficulty of Paying Living Expenses: Not on file  Food Insecurity:   . Worried About Charity fundraiser in the Last Year: Not on file  . Ran Out of Food in the Last Year: Not on file  Transportation Needs:   . Lack of Transportation (Medical): Not on file  . Lack of Transportation (Non-Medical): Not on file  Physical Activity:   . Days of Exercise per Week: Not on file  . Minutes of Exercise per Session: Not on file  Stress:   . Feeling of Stress : Not on file  Social Connections:   . Frequency of Communication with Friends and Family: Not on file  . Frequency of Social Gatherings with Friends and Family: Not on file  . Attends Religious Services: Not on file  . Active Member of Clubs or Organizations: Not on file  . Attends Archivist Meetings: Not on file  . Marital Status: Not on file  Intimate Partner Violence:   . Fear of Current or Ex-Partner: Not on file  . Emotionally Abused: Not on file  . Physically Abused: Not on file  . Sexually Abused: Not on file     Current Outpatient Medications:  .  acetaminophen (TYLENOL) 500 MG tablet, Take 500 mg by mouth every  6 (six) hours as needed., Disp: , Rfl:  .  SUMAtriptan (IMITREX) 50 MG tablet, Take 1 tablet by mouth at the onset of your headache, may repeat in 2 hours., Disp: , Rfl:  .  VENTOLIN HFA 108 (90 Base) MCG/ACT inhaler, , Disp: , Rfl:   Current Facility-Administered Medications:  .  PARAGARD INTRAUTERINE COPPER IUD 1 Units, 1 Units, Intrauterine, Once, Adilenne Ashworth B, PA-C     ROS:  Review of Systems BREAST: No symptoms   Objective: There were no vitals taken for this visit.   OBGyn Exam  Results: No results found for this or any previous visit (from the past 24 hour(s)).  Assessment/Plan: No diagnosis found.  No orders of the defined types were placed in this encounter.            GYN counsel  {counseling:16159}     F/U  No follow-ups on file.  Yoseline Andersson B. Sami Roes, PA-C 04/19/2019 1:10 PM

## 2019-04-20 ENCOUNTER — Ambulatory Visit: Payer: Self-pay | Admitting: Obstetrics and Gynecology

## 2019-04-29 ENCOUNTER — Encounter: Payer: Self-pay | Admitting: Obstetrics and Gynecology

## 2019-04-29 DIAGNOSIS — Z803 Family history of malignant neoplasm of breast: Secondary | ICD-10-CM | POA: Insufficient documentation

## 2019-04-29 NOTE — Progress Notes (Signed)
PCP:  [facility]   Chief Complaint  Patient presents with  . Gynecologic Exam  . STD testing     HPI:      Ms. Amanda [name] is a 34 y.o. G2P1011 who LMP was Patient's last menstrual period was 04/20/2019 (exact date)., presents today for her annual examination.  Her menses are regular every 28-30 days, lasting 4 days.  Dysmenorrhea mild, improved with NSAIDs. She does not have intermenstrual bleeding.  Sex activity: single partner, contraception - IUD. Paragard placed 05/21/16. Has a new partner, wants STD testing. Last Pap: May 01, 2016  Results were: no abnormalities /neg HPV DNA  Hx of STDs: chlamydia, HPV  There is a FH of breast cancer in her pat aunt, genetic testing not done. Pt to clarify age. There is no FH of ovarian cancer. The patient does not do self-breast exams.  Tobacco use: social Alcohol use: social No drug use.  Exercise: not active  She does get adequate calcium but not Vitamin D in her diet. Has a hx of Vit D deficiency in past.   Past Medical History:  Diagnosis Date  . Asthma, allergic   . Chlamydia   . Migraine with aura   . Migraine without aura   . Seasonal allergies      Past Surgical History:  Procedure Laterality Date  . lymph node removal    . Tubes in ear      Family History  Problem Relation Age of Onset  . Diabetes Maternal Uncle   . Breast cancer Paternal Aunt        57s  . Diabetes Paternal Aunt   . Diabetes Maternal Grandfather   . Healthy Mother   . Other Father        accidental death (hit by a train)  . Alcoholism Father     Social History   Socioeconomic History  . Marital status: Single    Spouse name: Not on file  . Number of children: Not on file  . Years of education: Not on file  . Highest education level: Not on file  Occupational History  . Not on file  Tobacco Use  . Smoking status: Never Smoker  . Smokeless tobacco: Never Used  Substance and Sexual Activity  . Alcohol use:  Yes    Alcohol/week: 0.0 standard drinks    Comment: rarely  . Drug use: No  . Sexual activity: Yes    Birth control/protection: I.U.D.    Comment: Paraguard  Other Topics Concern  . Not on file  Social History Narrative   Lives with her daughter   Social Determinants of Health   Financial Resource Strain:   . Difficulty of Paying Living Expenses: Not on file  Food Insecurity:   . Worried About Programme researcher, broadcasting/film/video in the Last Year: Not on file  . Ran Out of Food in the Last Year: Not on file  Transportation Needs:   . Lack of Transportation (Medical): Not on file  . Lack of Transportation (Non-Medical): Not on file  Physical Activity:   . Days of Exercise per Week: Not on file  . Minutes of Exercise per Session: Not on file  Stress:   . Feeling of Stress : Not on file  Social Connections:   . Frequency of Communication with Friends and Family: Not on file  . Frequency of Social Gatherings with Friends and Family: Not on file  . Attends Religious Services: Not on file  .  Active Member of Clubs or Organizations: Not on file  . Attends Banker Meetings: Not on file  . Marital Status: Not on file  Intimate Partner Violence:   . Fear of Current or Ex-Partner: Not on file  . Emotionally Abused: Not on file  . Physically Abused: Not on file  . Sexually Abused: Not on file     Current Outpatient Medications:  .  acetaminophen (TYLENOL) 500 MG tablet, Take 500 mg by mouth every 6 (six) hours as needed., Disp: , Rfl:  .  fluticasone-salmeterol (ADVAIR HFA) 115-21 MCG/ACT inhaler, Inhale into the lungs., Disp: , Rfl:  .  SUMAtriptan (IMITREX) 50 MG tablet, Take 1 tablet by mouth at the onset of your headache, may repeat in 2 hours., Disp: , Rfl:  .  VENTOLIN HFA 108 (90 Base) MCG/ACT inhaler, , Disp: , Rfl:   Current Facility-Administered Medications:  .  PARAGARD INTRAUTERINE COPPER IUD 1 Units, 1 Units, Intrauterine, Once, Lamica Mccart B,  PA-C     ROS:  Review of Systems  Constitutional: Negative for fatigue, fever and unexpected weight change.  Respiratory: Negative for cough, shortness of breath and wheezing.   Cardiovascular: Negative for chest pain, palpitations and leg swelling.  Gastrointestinal: Negative for blood in stool, constipation, diarrhea, nausea and vomiting.  Endocrine: Negative for cold intolerance, heat intolerance and polyuria.  Genitourinary: Negative for dyspareunia, dysuria, flank pain, frequency, genital sores, hematuria, menstrual problem, pelvic pain, urgency, vaginal bleeding, vaginal discharge and vaginal pain.  Musculoskeletal: Negative for back pain, joint swelling and myalgias.  Skin: Negative for rash.  Neurological: Negative for dizziness, syncope, light-headedness, numbness and headaches.  Hematological: Negative for adenopathy.  Psychiatric/Behavioral: Negative for agitation, confusion, sleep disturbance and suicidal ideas. The patient is not nervous/anxious.    BREAST: No symptoms   Objective: BP 114/80   Ht 5\' 5"  (1.651 m)   Wt 272 lb (123.4 kg)   LMP 04/20/2019 (Exact Date)   BMI 45.26 kg/m    Physical Exam Constitutional:      Appearance: She is well-developed.  Genitourinary:     Vulva, vagina, cervix, uterus, right adnexa and left adnexa normal.     No vulval lesion or tenderness noted.     No vaginal discharge, erythema or tenderness.     No cervical polyp.     IUD strings visualized.     Uterus is not enlarged or tender.     No right or left adnexal mass present.     Right adnexa not tender.     Left adnexa not tender.  Neck:     Thyroid: No thyromegaly.  Cardiovascular:     Rate and Rhythm: Normal rate and regular rhythm.     Heart sounds: Normal heart sounds. No murmur.  Pulmonary:     Effort: Pulmonary effort is normal.     Breath sounds: Normal breath sounds.  Chest:     Breasts:        Right: No mass, nipple discharge, skin change or tenderness.         Left: No mass, nipple discharge, skin change or tenderness.  Abdominal:     Palpations: Abdomen is soft.     Tenderness: There is no abdominal tenderness. There is no guarding.  Musculoskeletal:        General: Normal range of motion.     Cervical back: Normal range of motion.  Neurological:     General: No focal deficit present.     Mental Status:  She is alert and oriented to person, place, and time.     Cranial Nerves: No cranial nerve deficit.  Skin:    General: Skin is warm and dry.  Psychiatric:        Mood and Affect: Mood normal.        Behavior: Behavior normal.        Thought Content: Thought content normal.        Judgment: Judgment normal.  Vitals reviewed.     Assessment/Plan: Encounter for annual routine gynecological examination  Screening for STD (sexually transmitted disease) - Plan: Cervicovaginal ancillary only  Encounter for routine checking of intrauterine contraceptive device (IUD)  Family history of breast cancer--MyRisk testing discussed. Pt to clarify age of dx.             GYN counsel adequate intake of calcium and vitamin D, diet and exercise     F/U  Return in about 1 year (around 04/29/2020).  Deija Buhrman B. Dawsyn Zurn, PA-C 04/30/2019 10:41 AM

## 2019-04-30 ENCOUNTER — Ambulatory Visit (INDEPENDENT_AMBULATORY_CARE_PROVIDER_SITE_OTHER): Payer: BC Managed Care – PPO | Admitting: Obstetrics and Gynecology

## 2019-04-30 ENCOUNTER — Other Ambulatory Visit (HOSPITAL_COMMUNITY)
Admission: RE | Admit: 2019-04-30 | Discharge: 2019-04-30 | Disposition: A | Discharge status: Home / Self Care | Payer: BC Managed Care – PPO | Source: Ambulatory Visit | Attending: Obstetrics and Gynecology | Admitting: Obstetrics and Gynecology

## 2019-04-30 ENCOUNTER — Encounter: Payer: Self-pay | Admitting: Obstetrics and Gynecology

## 2019-04-30 ENCOUNTER — Other Ambulatory Visit: Payer: Self-pay

## 2019-04-30 VITALS — BP 114/80 | Ht 65.0 in | Wt 272.0 lb

## 2019-04-30 DIAGNOSIS — Z30431 Encounter for routine checking of intrauterine contraceptive device: Secondary | ICD-10-CM

## 2019-04-30 DIAGNOSIS — Z803 Family history of malignant neoplasm of breast: Secondary | ICD-10-CM

## 2019-04-30 DIAGNOSIS — Z01419 Encounter for gynecological examination (general) (routine) without abnormal findings: Secondary | ICD-10-CM | POA: Diagnosis not present

## 2019-04-30 DIAGNOSIS — Z113 Encounter for screening for infections with a predominantly sexual mode of transmission: Secondary | ICD-10-CM

## 2019-04-30 NOTE — Patient Instructions (Signed)
I value your feedback and entrusting us with your care. If you get a [facility] patient survey, I would appreciate you taking the time to let us know about your experience today. Thank you!  As of February 19, 2019, your lab results will be released to your MyChart immediately, before I even have a chance to see them. Please give me time to review them and contact you if there are any abnormalities. Thank you for your patience.  

## 2019-05-04 LAB — CERVICOVAGINAL ANCILLARY ONLY
Chlamydia: NEGATIVE
Comment: NEGATIVE
Comment: NORMAL
Neisseria Gonorrhea: NEGATIVE

## 2019-07-03 ENCOUNTER — Encounter: Payer: BC Managed Care – PPO | Attending: Family Medicine | Admitting: Dietician

## 2019-07-03 ENCOUNTER — Other Ambulatory Visit: Payer: Self-pay

## 2019-07-03 ENCOUNTER — Encounter: Payer: Self-pay | Admitting: Dietician

## 2019-07-03 VITALS — Ht 65.0 in | Wt 262.0 lb

## 2019-07-03 DIAGNOSIS — E119 Type 2 diabetes mellitus without complications: Secondary | ICD-10-CM | POA: Diagnosis not present

## 2019-07-03 NOTE — Patient Instructions (Addendum)
   Great job making healthy choices to reduce sugar and carbs! Keep up the awesome work!  Allow about 15 grams of carb with each meal, and gradually work up to at least 9 servings of carb foods each day. Higher fiber foods like beans, peas, whole grains, fruits, as well as nuts and seeds help with blood sugar control.  Continue regular exercise as much as posisble, great job so far!

## 2019-07-03 NOTE — Progress Notes (Signed)
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 Auditory . Visual . Hands-on  Current weight: 262lbs Height: 5'5" Medications, supplements: reconciled list in medical record  Progress and evaluation:   Patient reports recent diagnosis of Type 2 diabetes after reporting symptoms of frequent urination and excessive thirst. HbA1C was 12.6% on 06/02/19.  Has been checking BGs 3x a day, and has noticed improvement; resolution of symptoms.  She has decreased carb and sugar intake, feels bread was a major factor along with sugary beverages including sweet tea and sodas; has always consumed a significant amount of water. Less processed foods. Less snacking and smaller portions.    She reports long history of overweight, has lost about 10lbs in the past month since making changes.   She does not like to cook but has been working to make more meals at home. Meal schedule can be erratic.  High stress due to busy schedule working full time and taking classes, caring for 18yr old daughter.   Physical activity: walking 10-20 minutes 3-4 times a week, aiming for 1-2 times a day  Dietary Intake:  Usual eating pattern includes 2-3 meals and 1 snacks per day. Dining out frequency: 2-3 meals per week.  Breakfast: eggs, bacon/ [place] sausage;  Snack: usu none; occ apple and peanut butter Lunch: if hungry, low carb wrap; grilled fish; burger without bun + low-carb veg ie green beans, brocc, brussels sprouts, etc; occ meal replacement shake Snack: celery, pickles, cottage cheese Supper: same as lunch Snack: none or same as am, pm Beverages: water, diet cran-pineapple/ cherry juice; rarely coffee with splenda  Nutrition Care Education: Topics covered:   Basic nutrition: basic food groups, appropriate nutrient balance, appropriate meal and snack schedule, general nutrition guidelines    Weight control: importance of low sugar and low fat choices, portion control, estimated energy needs for weight loss at 1500kcal, provided guidance for 40% CHO, 30% protein, 30% fat. Advanced nutrition: cooking techniques for quick, simple meals Diabetes:  goals for BGs and HbA1C, potential long-term complications, appropriate meal and snack schedule, appropriate carb intake and balance, healthy carb choices, role of fiber, protein; role of physical activity,effects of stress on BG control   Nutritional Diagnosis:  [place]-2.2 Altered nutrition-related laboratory As related to Type 2 diabetes.  As evidenced by recent HbA1C of 12.6%. [place]-3.3 Overweight/obesity As related to excess calories, inactivity, stress.  As evidenced by patient with current BMI of 43.6, working on diet and lifestyle changes to lose weight and improve BG control.  Intervention:   Instruction and discussion as noted above.  Commended patient for changes she has already made and improvement in BG control.   She has likely been overly restricting carbohydrate intake with most meals, and will gradually allow for some starchy (with some whole grain/ high fiber) foods and fruits with meals and/or snacks.  Set goal of consistent and low-moderate carb intake.   No follow-up scheduled at this time; patient will schedule later if needed.  Education Materials given:  . General diet guidelines for Diabetes . Plate Planner with food lists, sample meal pattern . Sample menus . Goals/ instructions   Learner/ who was taught:  . Patient   Level of understanding: Marland Kitchen Verbalizes/ demonstrates competency   Demonstrated degree of understanding via:  Teach back Learning barriers: . None  Willingness to learn/ readiness for change: . Eager, change in progress  Monitoring and Evaluation:  Dietary  intake, exercise, BG control, and body weight      follow up: prn

## 2019-08-18 DIAGNOSIS — J4541 Moderate persistent asthma with (acute) exacerbation: Secondary | ICD-10-CM | POA: Diagnosis not present

## 2019-09-02 ENCOUNTER — Emergency Department: Payer: BC Managed Care – PPO

## 2019-09-02 ENCOUNTER — Other Ambulatory Visit: Payer: Self-pay

## 2019-09-02 ENCOUNTER — Inpatient Hospital Stay
Admit: 2019-09-02 | Discharge: 2019-09-04 | DRG: 202 | Disposition: A | Discharge status: Home / Self Care | Payer: BC Managed Care – PPO | Attending: Internal Medicine | Admitting: Internal Medicine

## 2019-09-02 ENCOUNTER — Encounter: Payer: Self-pay | Admitting: Emergency Medicine

## 2019-09-02 DIAGNOSIS — J189 Pneumonia, unspecified organism: Secondary | ICD-10-CM

## 2019-09-02 DIAGNOSIS — J45901 Unspecified asthma with (acute) exacerbation: Secondary | ICD-10-CM | POA: Diagnosis not present

## 2019-09-02 DIAGNOSIS — Z7984 Long term (current) use of oral hypoglycemic drugs: Secondary | ICD-10-CM

## 2019-09-02 DIAGNOSIS — J302 Other seasonal allergic rhinitis: Secondary | ICD-10-CM | POA: Diagnosis not present

## 2019-09-02 DIAGNOSIS — R079 Chest pain, unspecified: Secondary | ICD-10-CM | POA: Diagnosis not present

## 2019-09-02 DIAGNOSIS — Z6841 Body Mass Index (BMI) 40.0 and over, adult: Secondary | ICD-10-CM | POA: Diagnosis not present

## 2019-09-02 DIAGNOSIS — R0602 Shortness of breath: Secondary | ICD-10-CM | POA: Diagnosis not present

## 2019-09-02 DIAGNOSIS — F17200 Nicotine dependence, unspecified, uncomplicated: Secondary | ICD-10-CM | POA: Diagnosis not present

## 2019-09-02 DIAGNOSIS — E119 Type 2 diabetes mellitus without complications: Secondary | ICD-10-CM | POA: Diagnosis not present

## 2019-09-02 DIAGNOSIS — Z20822 Contact with and (suspected) exposure to covid-19: Secondary | ICD-10-CM | POA: Diagnosis present

## 2019-09-02 DIAGNOSIS — D649 Anemia, unspecified: Secondary | ICD-10-CM | POA: Diagnosis not present

## 2019-09-02 DIAGNOSIS — R7989 Other specified abnormal findings of blood chemistry: Secondary | ICD-10-CM | POA: Diagnosis not present

## 2019-09-02 DIAGNOSIS — A419 Sepsis, unspecified organism: Secondary | ICD-10-CM

## 2019-09-02 DIAGNOSIS — F17210 Nicotine dependence, cigarettes, uncomplicated: Secondary | ICD-10-CM | POA: Diagnosis present

## 2019-09-02 DIAGNOSIS — Z79899 Other long term (current) drug therapy: Secondary | ICD-10-CM

## 2019-09-02 DIAGNOSIS — I10 Essential (primary) hypertension: Secondary | ICD-10-CM | POA: Diagnosis not present

## 2019-09-02 DIAGNOSIS — J129 Viral pneumonia, unspecified: Secondary | ICD-10-CM | POA: Diagnosis not present

## 2019-09-02 DIAGNOSIS — G43109 Migraine with aura, not intractable, without status migrainosus: Secondary | ICD-10-CM | POA: Diagnosis not present

## 2019-09-02 DIAGNOSIS — Z833 Family history of diabetes mellitus: Secondary | ICD-10-CM

## 2019-09-02 DIAGNOSIS — Z7951 Long term (current) use of inhaled steroids: Secondary | ICD-10-CM | POA: Diagnosis not present

## 2019-09-02 DIAGNOSIS — Z803 Family history of malignant neoplasm of breast: Secondary | ICD-10-CM | POA: Diagnosis not present

## 2019-09-02 DIAGNOSIS — J45909 Unspecified asthma, uncomplicated: Secondary | ICD-10-CM

## 2019-09-02 HISTORY — DX: Type 2 diabetes mellitus without complications: E11.9

## 2019-09-02 LAB — COMPREHENSIVE METABOLIC PANEL
ALT: 20 U/L (ref 0–44)
AST: 17 U/L (ref 15–41)
Albumin: 3.8 g/dL (ref 3.5–5.0)
Alkaline Phosphatase: 92 U/L (ref 38–126)
Anion gap: 8 (ref 5–15)
BUN: 9 mg/dL (ref 6–20)
CO2: 19 mmol/L — ABNORMAL LOW (ref 22–32)
Calcium: 9 mg/dL (ref 8.9–10.3)
Chloride: 113 mmol/L — ABNORMAL HIGH (ref 98–111)
Creatinine, Ser: 0.95 mg/dL (ref 0.44–1.00)
GFR calc Af Amer: >60 mL/min (ref >60)
GFR calc non Af Amer: >60 mL/min (ref >60)
Glucose, Bld: 147 mg/dL — ABNORMAL HIGH (ref 70–99)
Potassium: 4.1 mmol/L (ref 3.5–5.1)
Sodium: 140 mmol/L (ref 135–145)
Total Bilirubin: 0.5 mg/dL (ref 0.3–1.2)
Total Protein: 7.4 g/dL (ref 6.5–8.1)

## 2019-09-02 LAB — CBC WITH DIFFERENTIAL/PLATELET
Abs Immature Granulocytes: 0.05 10*3/uL (ref 0.00–0.07)
Basophils Absolute: 0.1 10*3/uL (ref 0.0–0.1)
Basophils Relative: 1 %
Eosinophils Absolute: 0.6 10*3/uL — ABNORMAL HIGH (ref 0.0–0.5)
Eosinophils Relative: 4 %
HCT: 39.5 % (ref 36.0–46.0)
Hemoglobin: 13.3 g/dL (ref 12.0–15.0)
Immature Granulocytes: 0 %
Lymphocytes Relative: 12 %
Lymphs Abs: 1.7 10*3/uL (ref 0.7–4.0)
MCH: 28.4 pg (ref 26.0–34.0)
MCHC: 33.7 g/dL (ref 30.0–36.0)
MCV: 84.4 fL (ref 80.0–100.0)
Monocytes Absolute: 0.7 10*3/uL (ref 0.1–1.0)
Monocytes Relative: 5 %
Neutro Abs: 11 10*3/uL — ABNORMAL HIGH (ref 1.7–7.7)
Neutrophils Relative %: 78 %
Platelets: 327 10*3/uL (ref 150–400)
RBC: 4.68 MIL/uL (ref 3.87–5.11)
RDW: 14.1 % (ref 11.5–15.5)
WBC: 14.2 10*3/uL — ABNORMAL HIGH (ref 4.0–10.5)
nRBC: 0 % (ref 0.0–0.2)

## 2019-09-02 LAB — GLUCOSE, CAPILLARY: Glucose-Capillary: 238 mg/dL — ABNORMAL HIGH (ref 70–99)

## 2019-09-02 LAB — HIV ANTIBODY (ROUTINE TESTING W REFLEX): HIV Screen 4th Generation wRfx: NONREACTIVE

## 2019-09-02 LAB — LACTIC ACID, PLASMA
Lactic Acid, Venous: 2.5 mmol/L (ref 0.5–1.9)
Lactic Acid, Venous: 2.5 mmol/L (ref 0.5–1.9)

## 2019-09-02 LAB — SARS CORONAVIRUS 2 BY RT PCR (HOSPITAL ORDER, PERFORMED IN ~~LOC~~ HOSPITAL LAB): SARS Coronavirus 2: NEGATIVE

## 2019-09-02 LAB — TROPONIN I (HIGH SENSITIVITY): Troponin I (High Sensitivity): 2 ng/L (ref <18)

## 2019-09-02 LAB — HCG, QUANTITATIVE, PREGNANCY: hCG, Beta Chain, Quant, S: <1 m[IU]/mL (ref <5)

## 2019-09-02 MED ORDER — SUMATRIPTAN SUCCINATE 50 MG PO TABS: 50.0000 mg | ORAL_TABLET | ORAL | Status: DC | PRN

## 2019-09-02 MED ORDER — VITAMIN D3 25 MCG (1000 UNIT) PO TABS
1000.0000 [IU] | ORAL_TABLET | Freq: Every day | ORAL | Status: DC
  Administered 2019-09-03 – 2019-09-04 (×2): 1000 [IU] via ORAL
  Filled 2019-09-02 (×3): qty 1

## 2019-09-02 MED ORDER — INSULIN ASPART 100 UNIT/ML ~~LOC~~ SOLN
0.0000 [IU] | Freq: Three times a day (TID) | SUBCUTANEOUS | Status: DC
  Administered 2019-09-02 – 2019-09-03 (×2): 7 [IU] via SUBCUTANEOUS
  Administered 2019-09-03: 3 [IU] via SUBCUTANEOUS
  Administered 2019-09-03 – 2019-09-04 (×2): 4 [IU] via SUBCUTANEOUS
  Filled 2019-09-02 (×5): qty 1

## 2019-09-02 MED ORDER — SODIUM CHLORIDE 0.9 % IV BOLUS
1000.0000 mL | Freq: Once | INTRAVENOUS | Status: AC
  Administered 2019-09-02: 1000 mL via INTRAVENOUS

## 2019-09-02 MED ORDER — IPRATROPIUM-ALBUTEROL 0.5-2.5 (3) MG/3ML IN SOLN
3.0000 mL | Freq: Once | RESPIRATORY_TRACT | Status: AC
  Administered 2019-09-02: 3 mL via RESPIRATORY_TRACT

## 2019-09-02 MED ORDER — ONDANSETRON HCL 4 MG/2ML IJ SOLN: 4.0000 mg | Freq: Four times a day (QID) | INTRAMUSCULAR | Status: DC | PRN

## 2019-09-02 MED ORDER — IPRATROPIUM-ALBUTEROL 0.5-2.5 (3) MG/3ML IN SOLN
3.0000 mL | Freq: Four times a day (QID) | RESPIRATORY_TRACT | Status: DC | PRN
  Administered 2019-09-02 – 2019-09-04 (×4): 3 mL via RESPIRATORY_TRACT
  Filled 2019-09-02 (×4): qty 3

## 2019-09-02 MED ORDER — IOHEXOL 350 MG/ML SOLN
75.0000 mL | Freq: Once | INTRAVENOUS | Status: AC | PRN
  Administered 2019-09-02: 75 mL via INTRAVENOUS

## 2019-09-02 MED ORDER — MAGNESIUM SULFATE 2 GM/50ML IV SOLN
2.0000 g | Freq: Once | INTRAVENOUS | Status: AC
  Administered 2019-09-02: 2 g via INTRAVENOUS
  Filled 2019-09-02: qty 50

## 2019-09-02 MED ORDER — SODIUM CHLORIDE 0.9 % IV BOLUS
500.0000 mL | Freq: Once | INTRAVENOUS | Status: AC
  Administered 2019-09-02: 500 mL via INTRAVENOUS

## 2019-09-02 MED ORDER — LEVOFLOXACIN IN D5W 750 MG/150ML IV SOLN
750.0000 mg | Freq: Once | INTRAVENOUS | Status: AC
  Administered 2019-09-02: 750 mg via INTRAVENOUS
  Filled 2019-09-02: qty 150

## 2019-09-02 MED ORDER — MOMETASONE FURO-FORMOTEROL FUM 200-5 MCG/ACT IN AERO
2.0000 | INHALATION_SPRAY | Freq: Two times a day (BID) | RESPIRATORY_TRACT | Status: DC
  Administered 2019-09-02 – 2019-09-04 (×4): 2 via RESPIRATORY_TRACT
  Filled 2019-09-02 (×2): qty 8.8

## 2019-09-02 MED ORDER — LORATADINE 10 MG PO TABS
10.0000 mg | ORAL_TABLET | Freq: Every evening | ORAL | Status: DC
  Administered 2019-09-02 – 2019-09-03 (×2): 10 mg via ORAL
  Filled 2019-09-02 (×2): qty 1

## 2019-09-02 MED ORDER — METHYLPREDNISOLONE SODIUM SUCC 40 MG IJ SOLR
40.0000 mg | Freq: Two times a day (BID) | INTRAMUSCULAR | Status: DC
  Administered 2019-09-03 – 2019-09-04 (×3): 40 mg via INTRAVENOUS
  Filled 2019-09-02 (×4): qty 1

## 2019-09-02 MED ORDER — SODIUM CHLORIDE 0.9 % IV SOLN: INTRAVENOUS | Status: DC

## 2019-09-02 MED ORDER — METHYLPREDNISOLONE SODIUM SUCC 125 MG IJ SOLR
125.0000 mg | Freq: Once | INTRAMUSCULAR | Status: AC
  Administered 2019-09-02: 125 mg via INTRAVENOUS
  Filled 2019-09-02: qty 2

## 2019-09-02 MED ORDER — ONDANSETRON HCL 4 MG PO TABS: 4.0000 mg | ORAL_TABLET | Freq: Four times a day (QID) | ORAL | Status: DC | PRN

## 2019-09-02 MED ORDER — GUAIFENESIN 100 MG/5ML PO SOLN
200.0000 mg | ORAL | Status: DC | PRN
  Filled 2019-09-02: qty 10

## 2019-09-02 MED ORDER — SUMATRIPTAN SUCCINATE 50 MG PO TABS
50.0000 mg | ORAL_TABLET | ORAL | Status: DC | PRN
  Filled 2019-09-02: qty 1

## 2019-09-02 MED ORDER — ALBUTEROL SULFATE (2.5 MG/3ML) 0.083% IN NEBU
2.5000 mg | INHALATION_SOLUTION | Freq: Once | RESPIRATORY_TRACT | Status: AC
  Administered 2019-09-02: 2.5 mg via RESPIRATORY_TRACT
  Filled 2019-09-02: qty 3

## 2019-09-02 MED ORDER — ENOXAPARIN SODIUM 40 MG/0.4ML ~~LOC~~ SOLN
40.0000 mg | SUBCUTANEOUS | Status: DC
  Administered 2019-09-02 – 2019-09-03 (×2): 40 mg via SUBCUTANEOUS
  Filled 2019-09-02 (×2): qty 0.4

## 2019-09-02 MED ORDER — ACETAMINOPHEN 500 MG PO TABS
500.0000 mg | ORAL_TABLET | Freq: Four times a day (QID) | ORAL | Status: DC | PRN
  Administered 2019-09-03: 500 mg via ORAL
  Filled 2019-09-02: qty 1

## 2019-09-02 MED ORDER — IPRATROPIUM-ALBUTEROL 0.5-2.5 (3) MG/3ML IN SOLN
3.0000 mL | Freq: Once | RESPIRATORY_TRACT | Status: AC
  Administered 2019-09-02: 3 mL via RESPIRATORY_TRACT
  Filled 2019-09-02: qty 9

## 2019-09-02 NOTE — Consult Note (Signed)
PHARMACY -  BRIEF ANTIBIOTIC NOTE   Pharmacy has received consult(s) for Levaquin from an ED provider.  The patient's profile has been reviewed for ht/wt/allergies/indication/available labs.    One time order(s) placed for Levaquin 750mg  IV x 1  Further antibiotics/pharmacy consults should be ordered by admitting physician if indicated.                       Thank you,  , PharmD, BCPS Clinical Pharmacist 09/02/2019 1:03 PM

## 2019-09-02 NOTE — ED Notes (Signed)
Pt visualized in NAD at this time, pt continues to do breathing treatment, states feels better at this time.

## 2019-09-02 NOTE — ED Provider Notes (Signed)
[facility] Emergency Department Provider Note  ____________________________________________   First MD Initiated Contact with Patient 09/02/19 [phone]     (approximate)  I have reviewed the triage vital signs and the nursing notes.   HISTORY  Chief Complaint Chest Pain and Cough    HPI Amanda [name] is a 34 y.o. female with asthma who comes in with chest pain.  Patient reports since last night having wheezing with productive cough, yellow sputum and shortness of breath with left-sided chest pain.  Patient states that the chest pain has now gone away.  She does report continued shortness of breath and wheezing.  She states that her symptoms started yesterday, moderate, constant, not better with her normal allergy pills and breathing treatments.  She denies a history of blood clots, unilateral leg swelling, has a nonhormonal IUD, denies recent travel or recent surgery.  Patient states that she had pneumonia a few years ago she was worried that she might have pneumonia again.  Patient states that 2 weeks ago she was treated with a course of prednisone and doxycycline.  She states that she has not been vaccinated.  Denies having Covid previously.          Past Medical History:  Diagnosis Date  . Asthma, allergic   . Chlamydia   . Migraine with aura   . Migraine without aura   . Seasonal allergies     Patient Active Problem List   Diagnosis Date Noted  . Family history of breast cancer 04/29/2019    Past Surgical History:  Procedure Laterality Date  . lymph node removal    . Tubes in ear      Prior to Admission medications   Medication Sig Start Date End Date Taking? Authorizing Provider  ACCU-CHEK GUIDE test strip USE 1 THREE TIMES DAILY AS DIRECTED 06/10/19   [provider]  acetaminophen (TYLENOL) 500 MG tablet Take 500 mg by mouth every 6 (six) hours as needed.    [provider]  Blood Glucose Monitoring Suppl (ACCU-CHEK  GUIDE) w/Device KIT See admin instructions. 06/10/19   [provider]  cholecalciferol (VITAMIN D) 25 MCG (1000 UNIT) tablet Take 1,000 Units by mouth daily.    [provider]  fluticasone-salmeterol (ADVAIR HFA) 115-21 MCG/ACT inhaler Inhale into the lungs. 09/10/18 09/10/19  [provider]  levocetirizine (XYZAL) 5 MG tablet Take by mouth. 06/02/19   [provider]  metFORMIN (GLUCOPHAGE) 500 MG tablet Take by mouth. 06/10/19 06/09/20  [provider]  SUMAtriptan (IMITREX) 50 MG tablet Take 1 tablet by mouth at the onset of your headache, may repeat in 2 hours. 05/20/17   [provider]  topiramate (TOPAMAX) 25 MG tablet Take by mouth. 06/02/19 06/01/20  [provider]  VENTOLIN HFA 108 (90 Base) MCG/ACT inhaler  04/27/16   [provider]    Allergies Wendee Copp tar oil  [betula alba oil], Other, Beclomethasone, and Cefaclor  Family History  Problem Relation Age of Onset  . Diabetes Maternal Uncle   . Breast cancer Paternal Aunt        47s  . Diabetes Paternal Aunt   . Diabetes Maternal Grandfather   . Healthy Mother   . Other Father        accidental death (hit by a train)  . Alcoholism Father     Social History Social History   Tobacco Use  . Smoking status: Current Some Day Smoker    Types: Cigarettes  .  Smokeless tobacco: Never Used  . Tobacco comment: occasional, socially  Vaping Use  . Vaping Use: Never used  Substance Use Topics  . Alcohol use: Yes    Alcohol/week: 0.0 standard drinks    Comment: rarely  . Drug use: No      Review of Systems Constitutional: No fever/chills Eyes: No visual changes. ENT: No sore throat. Cardiovascular: Positive chest pain Respiratory: Positive shortness of breath, wheezing, coughing Gastrointestinal: No abdominal pain.  No nausea, no vomiting.  No diarrhea.  No constipation. Genitourinary: Negative for dysuria. Musculoskeletal: Negative for back pain. Skin:  Negative for rash. Neurological: Negative for headaches, focal weakness or numbness. All other ROS negative ____________________________________________   PHYSICAL EXAM:  VITAL SIGNS: ED Triage Vitals  Enc Vitals Group     BP 09/02/19 0608 119/89     Pulse Rate 09/02/19 0603 (!) 124     Resp 09/02/19 0603 16     Temp 09/02/19 0603 99.2 F (37.3 C)     Temp Source 09/02/19 0603 Oral     SpO2 09/02/19 0603 96 %     Weight 09/02/19 0603 247 lb (112 kg)     Height 09/02/19 0603 5' 5"  (1.651 m)     Head Circumference --      Peak Flow --      Pain Score 09/02/19 0602 4     Pain Loc --      Pain Edu? --      Excl. in [place]? --     Constitutional: Alert and oriented. Well appearing and in no acute distress. Eyes: Conjunctivae are normal. EOMI. Head: Atraumatic. Nose: No congestion/rhinnorhea. Mouth/Throat: Mucous membranes are moist.   Neck: No stridor. Trachea Midline. FROM Cardiovascular: Normal rate, regular rhythm. Grossly normal heart sounds.  Good peripheral circulation. Respiratory: Normal respiratory effort.  No retractions.  Diffuse wheezing bilaterally, frequent coughing Gastrointestinal: Soft and nontender. No distention. No abdominal bruits.  Musculoskeletal: No lower extremity tenderness nor edema.  No joint effusions. Neurologic:  Normal speech and language. No gross focal neurologic deficits are appreciated.  Skin:  Skin is warm, dry and intact. No rash noted. Psychiatric: Mood and affect are normal. Speech and behavior are normal. GU: Deferred   ____________________________________________   LABS (all labs ordered are listed, but only abnormal results are displayed)  Labs Reviewed  CBC WITH DIFFERENTIAL/PLATELET - Abnormal; Notable for the following components:      Result Value   WBC 14.2 (*)    Neutro Abs 11.0 (*)    Eosinophils Absolute 0.6 (*)    All other components within normal limits  COMPREHENSIVE METABOLIC PANEL - Abnormal; Notable for the  following components:   Chloride 113 (*)    CO2 19 (*)    Glucose, Bld 147 (*)    All other components within normal limits  SARS CORONAVIRUS 2 BY RT PCR (HOSPITAL ORDER, [facility])  TROPONIN I (HIGH SENSITIVITY)   ____________________________________________   ED ECG REPORT I, Vanessa Gerster, the attending physician, personally viewed and interpreted this ECG.  EKG is sinus tachycardia rate of 120, no ST elevations, no T wave inversions, normal intervals ____________________________________________  RADIOLOGY Robert Bellow, personally viewed and evaluated these images (plain radiographs) as part of my medical decision making, as well as reviewing the written report by the radiologist.  ED MD interpretation: No pneumonia  Official radiology report(s): DG Chest 2 View  Result Date: 09/02/2019 CLINICAL DATA:  Chest pain EXAM: CHEST -  2 VIEW COMPARISON:  Radiograph 12/21/2017 FINDINGS: No consolidation, features of edema, pneumothorax, or effusion. Pulmonary vascularity is normally distributed. The cardiomediastinal contours are unremarkable. No acute osseous or soft tissue abnormality. IMPRESSION: No acute cardiopulmonary abnormality. Electronically Signed   By: Lovena Le M.D.   On: 09/02/2019 06:51    ____________________________________________   PROCEDURES  Procedure(s) performed (including Critical Care):  .Critical Care Performed by: Vanessa Morrow, MD Authorized by: Vanessa Sarepta, MD   Critical care provider statement:    Critical care time (minutes):  31   Critical care was necessary to treat or prevent imminent or life-threatening deterioration of the following conditions:  Sepsis   Critical care was time spent personally by me on the following activities:  Discussions with consultants, evaluation of patient's response to treatment, examination of patient, ordering and performing treatments and interventions, ordering and review of  laboratory studies, ordering and review of radiographic studies, pulse oximetry, re-evaluation of patient's condition, obtaining history from patient or surrogate and review of old charts .1-3 Lead EKG Interpretation Performed by: Vanessa [place]ado, MD Authorized by: Vanessa Pacifica, MD     Interpretation: abnormal     ECG rate:  110-140   ECG rate assessment: tachycardic     Rhythm: sinus rhythm     Ectopy: none     Conduction: normal       ____________________________________________   INITIAL IMPRESSION / ASSESSMENT AND PLAN / ED COURSE   Amanda [name] was evaluated in Emergency Department on 09/02/2019 for the symptoms described in the history of present illness. She was evaluated in the context of the global COVID-19 pandemic, which necessitated consideration that the patient might be at risk for infection with the SARS-CoV-2 virus that causes COVID-19. Institutional protocols and algorithms that pertain to the evaluation of patients at risk for COVID-19 are in a state of rapid change based on information released by regulatory bodies including the CDC and federal and state organizations. These policies and algorithms were followed during the patient's care in the ED.    Most Likely DDx:  -I suspect this is most likely secondary to her asthma given the diffuse wheezing, coughing.  DDx that was also considered d/t potential to cause harm, but was found less likely based on history and physical (as detailed above): -PNA (no fevers, cough but CXR to evaluate) -PNX (reassured with equal b/l breath sounds, CXR to evaluate) -Symptomatic anemia (will get H&H) -Pulmonary embolism as no sob at rest, not pleuritic in nature, no hypoxia I have considered this is a possibility but patient has no other risk factors other than her being tachycardic upon arrival . Will give rx for asthma 1st and then discuss CT after. -Aortic Dissection as no tearing pain and no radiation to the mid back, pulses  equal -Pericarditis no rub on exam, EKG changes or hx to suggest dx -Tamponade (no notable SOB, tachycardic, hypotensive) -Esophageal rupture (no h/o diffuse vomitting/no crepitus)  Labs show elevated white count at 14.2  Patient's tachycardia still elevated after fluids.  She states that she feels better.  We discussed D-dimer of her CT versus careful observation for PE.  Patient with receiving CT scan.  CT scan concerning for possible pneumonia could be inflammatory versus viral in nature.  Patient's Covid is negative.    During ambulation trial patient's oxygen level stayed above 88% however her respiratory rate went above 40s and her heart rate went to above 140.  She states that her symptoms  are starting get worse again.  We will give another albuterol neb and discussed the possible team for admission  Given hes tachycardia will give another liter of fluids.  Patient is never been hypotensive.     ____________________________________________   FINAL CLINICAL IMPRESSION(S) / ED DIAGNOSES   Final diagnoses:  Uncomplicated asthma, unspecified asthma severity, unspecified whether persistent  Community acquired pneumonia, unspecified laterality  Sepsis, due to unspecified organism, unspecified whether acute organ dysfunction present ([place])     MEDICATIONS GIVEN DURING THIS VISIT:  Medications  guaiFENesin (ROBITUSSIN) 100 MG/5ML solution 200 mg (has no administration in time range)  levofloxacin (LEVAQUIN) IVPB 750 mg (has no administration in time range)  albuterol (PROVENTIL) (2.5 MG/3ML) 0.083% nebulizer solution 2.5 mg (has no administration in time range)  ipratropium-albuterol (DUONEB) 0.5-2.5 (3) MG/3ML nebulizer solution 3 mL (3 mLs Nebulization Given 09/02/19 0840)  ipratropium-albuterol (DUONEB) 0.5-2.5 (3) MG/3ML nebulizer solution 3 mL (3 mLs Nebulization Given 09/02/19 0840)  ipratropium-albuterol (DUONEB) 0.5-2.5 (3) MG/3ML nebulizer solution 3 mL (3 mLs Nebulization  Given 09/02/19 0840)  magnesium sulfate IVPB 2 g 50 mL (0 g Intravenous Stopped 09/02/19 0851)  methylPREDNISolone sodium succinate (SOLU-MEDROL) 125 mg/2 mL injection 125 mg (125 mg Intravenous Given 09/02/19 0835)  sodium chloride 0.9 % bolus 500 mL (0 mLs Intravenous Stopped 09/02/19 0943)  iohexol (OMNIPAQUE) 350 MG/ML injection 75 mL (75 mLs Intravenous Contrast Given 09/02/19 1043)  sodium chloride 0.9 % bolus 1,000 mL (1,000 mLs Intravenous New Bag/Given 09/02/19 1301)     ED Discharge Orders    None       Note:  This document was prepared using Dragon voice recognition software and may include unintentional dictation errors.   Vanessa Lamont, MD 09/02/19 [phone]

## 2019-09-02 NOTE — ED Notes (Signed)
Medications administered per order. Pt provided with pillow. No further needs noted at this time. Call bell remains within reach of patient at this time.

## 2019-09-02 NOTE — ED Notes (Signed)
Pt ambulated by this RN. While ambulating pt's HR noted to increase to 140, RR increased to 40 and pt c/o difficulty breathing. Pt O2 sat noted to be 98% while ambulating. Pt back to bed at this time. EDP made aware.

## 2019-09-02 NOTE — ED Notes (Signed)
Pt unhooked to go to the bathroom at this time. Pt visualized in NAD. Pt states she is able to go to the bathroom on her own without assistance.

## 2019-09-02 NOTE — ED Notes (Signed)
Admitting MD at bedside at this time.

## 2019-09-02 NOTE — ED Triage Notes (Signed)
Patient ambulatory to triage with steady gait, without difficulty or distress noted, mask in place; pt reports since last night having wheezing, prod coughyellow sputum, SHOB, and left sided CP

## 2019-09-02 NOTE — ED Notes (Signed)
Pt given meal tray and water at this time. Pt denies further needs. A&O x4. Call bell remains within reach.

## 2019-09-02 NOTE — ED Notes (Signed)
Pt alert, family with pt.  Pt eating dinner tray and watching tv.  Iv fluids infusing  Pt waiting on admission.

## 2019-09-02 NOTE — ED Notes (Signed)
Date and time results received: 09/02/19 1:25 PM  (use smartphrase ".now" to insert current time)  Test: Lactic Critical Value: 2.5  Name of Provider Notified: Dr. Fuller Plan  Orders Received? Or Actions Taken?: Critical results acknowledged

## 2019-09-02 NOTE — ED Notes (Addendum)
Date and time results received: 09/02/19 1504 (use smartphrase ".now" to insert current time)  Test: Lactic Critical Value: 2.5  Name of Provider Notified: Agbata  Orders Received? Or Actions Taken?: Awaiting orders, admitting MD notified via secure chat

## 2019-09-02 NOTE — ED Notes (Signed)
Pt unhooked from cardiac monitor and is ambulatory to bathroom.

## 2019-09-02 NOTE — H&P (Signed)
History and Physical    Amanda [name] FXJ:[id number] DOB: 02/02/1986 DOA: 09/02/2019  PCP: Patient, No Pcp Per   Patient coming from: Home  I have personally briefly reviewed patient's old medical records in [facility]  Chief Complaint: Shortness of breath  HPI: Amanda [name] is a 34 y.o. female with medical history significant for asthma, nicotine dependence, morbid obesity who presented to the emergency room for evaluation of worsening shortness of breath over the last 24 hours.  Patient states that for the last 2 weeks she has had chest congestion as well as a cough productive of occasional yellow/green phlegm as well as wheezing.  She was treated with systemic steroids and antibiotic therapy with some improvement in her symptoms until last night when she developed shortness of breath that did not improve following the use of her inhaler or nebulizer treatment.  Early this morning she woke up and was unable to catch her breath so she came to the emergency room.  She received nebulizer treatment, IV magnesium and systemic steroids. She denies having any fever or chills.  Denies having any sick contacts.  She denies having any nausea, vomiting, abdominal pain or changes in her bowel habits.  She is not vaccinated and has not had Covid 19 viral infection infection.  Her daughter has been sick with similar symptoms. She had a chest x-ray which showed no acute cardiopulmonary abnormality. She had a CT angiogram of the chest which showed no evidence of acute pulmonary embolism or other acute vascular findings. Patchy ground-glass opacities in the left upper and right lower lobes, likely inflammatory/infectious. Consider viral pneumonia. No confluent airspace opacity or suspicious pulmonary nodule. Twelve-lead EKG shows sinus tachycardia. Her SARS Coronavirus 2 test was negative.  White count of 14,000 and elevated lactic acid level of 2.5 Attempts were made to discharge patient from the  emergency room and with ambulation she had worsening shortness of breath and tachycardia.  She will be referred to observation status   ED Course: Patient is a 34 year old Caucasian female with a history of asthma who presents to the emergency room for evaluation of worsening shortness of breath.  She received IV magnesium, Solu-Medrol and antibiotic therapy.  CT angiogram of the chest showed patchy groundglass opacities in the left upper and right lower lobes but patient has a negative Covid test.  She will be referred to observation status for further evaluation  Review of Systems: As per HPI otherwise 10 point review of systems negative.    Past Medical History:  Diagnosis Date  . Asthma, allergic   . Chlamydia   . Diabetes mellitus without complication (HCC)   . Migraine with aura   . Migraine without aura   . Seasonal allergies     Past Surgical History:  Procedure Laterality Date  . lymph node removal    . Tubes in ear       reports that she has been smoking cigarettes. She has never used smokeless tobacco. She reports current alcohol use. She reports that she does not use drugs.  Allergies  Allergen Reactions  . Birch Tar Oil  [Betula Alba Oil] Anaphylaxis, Itching, Rash, Shortness Of Breath and Swelling  . Other Anaphylaxis, Itching, Rash, Shortness Of Breath and Swelling  . Beclomethasone Itching  . Cefaclor Rash and Other (See Comments)    Family History  Problem Relation Age of Onset  . Diabetes Maternal Uncle   . Breast cancer Paternal Aunt  40s  . Diabetes Paternal Aunt   . Diabetes Maternal Grandfather   . Healthy Mother   . Other Father        accidental death (hit by a train)  . Alcoholism Father      Prior to Admission medications   Medication Sig Start Date End Date Taking? Authorizing Provider  cholecalciferol (VITAMIN D) 25 MCG (1000 UNIT) tablet Take 1,000 Units by mouth daily.   Yes [provider]  fluticasone-salmeterol (ADVAIR  HFA) 115-21 MCG/ACT inhaler Inhale 2 puffs into the lungs 2 (two) times daily. INHALE 2 PUFFS BY MOUTH EVERY 12 HOURS 09/10/18 09/10/19 Yes [provider]  levocetirizine (XYZAL) 5 MG tablet Take 5 mg by mouth every evening.  06/02/19  Yes [provider]  metFORMIN (GLUCOPHAGE) 1000 MG tablet Take 1,000 mg by mouth 2 (two) times daily with a meal.  06/10/19 06/09/20 Yes [provider]  topiramate (TOPAMAX) 25 MG tablet Take by mouth. 06/02/19 06/01/20 Yes [provider]  VENTOLIN HFA 108 (90 Base) MCG/ACT inhaler  04/27/16  Yes [provider]  acetaminophen (TYLENOL) 500 MG tablet Take 500 mg by mouth every 6 (six) hours as needed.    [provider]  SUMAtriptan (IMITREX) 50 MG tablet Take 1 tablet by mouth at the onset of your headache, may repeat in 2 hours. 05/20/17   [provider]    Physical Exam: Vitals:   09/02/19 1230 09/02/19 1231 09/02/19 1400 09/02/19 1430  BP: (!) 132/99  (!) 132/102 (!) 148/106  Pulse: (!) 115 (!) 140 (!) 125 (!) 119  Resp: 13 (!) 41 (!) 25 (!) 22  Temp:      TempSrc:      SpO2: 97%  95% 93%  Weight:      Height:         Vitals:   09/02/19 1230 09/02/19 1231 09/02/19 1400 09/02/19 1430  BP: (!) 132/99  (!) 132/102 (!) 148/106  Pulse: (!) 115 (!) 140 (!) 125 (!) 119  Resp: 13 (!) 41 (!) 25 (!) 22  Temp:      TempSrc:      SpO2: 97%  95% 93%  Weight:      Height:        Constitutional: NAD, alert and oriented x 3.  Very tearful Eyes: PERRL, lids and conjunctivae normal ENMT: Mucous membranes are moist.  Neck: normal, supple, no masses, no thyromegaly Respiratory: Bilateral air entry, scattered wheezing, no crackles. Normal respiratory effort. No accessory muscle use.  Cardiovascular: Tachycardia, no murmurs / rubs / gallops. No extremity edema. 2+ pedal pulses. No carotid bruits.  Abdomen: no tenderness, no masses palpated. No hepatosplenomegaly. Bowel sounds positive.  Musculoskeletal:  no clubbing / cyanosis. No joint deformity upper and lower extremities.  Skin: no rashes, lesions, ulcers.  Neurologic: No gross focal neurologic deficit. Psychiatric: Normal mood and affect.   Labs on Admission: I have personally reviewed following labs and imaging studies  CBC: Recent Labs  Lab 09/02/19 0618  WBC 14.2*  NEUTROABS 11.0*  HGB 13.3  HCT 39.5  MCV 84.4  PLT 327   Basic Metabolic Panel: Recent Labs  Lab 09/02/19 0618  NA 140  K 4.1  CL 113*  CO2 19*  GLUCOSE 147*  BUN 9  CREATININE 0.95  CALCIUM 9.0   GFR: Estimated Creatinine Clearance: 103.1 mL/min (by C-G formula based on SCr of 0.95 mg/dL). Liver Function Tests: Recent Labs  Lab 09/02/19 0618  AST 17  ALT  20  ALKPHOS 92  BILITOT 0.5  PROT 7.4  ALBUMIN 3.8   No results for input(s): LIPASE, AMYLASE in the last 168 hours. No results for input(s): AMMONIA in the last 168 hours. Coagulation Profile: No results for input(s): INR, PROTIME in the last 168 hours. Cardiac Enzymes: No results for input(s): CKTOTAL, CKMB, CKMBINDEX, TROPONINI in the last 168 hours. BNP (last 3 results) No results for input(s): PROBNP in the last 8760 hours. HbA1C: No results for input(s): HGBA1C in the last 72 hours. CBG: No results for input(s): GLUCAP in the last 168 hours. Lipid Profile: No results for input(s): CHOL, HDL, LDLCALC, TRIG, CHOLHDL, LDLDIRECT in the last 72 hours. Thyroid Function Tests: No results for input(s): TSH, T4TOTAL, FREET4, T3FREE, THYROIDAB in the last 72 hours. Anemia Panel: No results for input(s): VITAMINB12, FOLATE, FERRITIN, TIBC, IRON, RETICCTPCT in the last 72 hours. Urine analysis:    Component Value Date/Time   COLORURINE Yellow 06/23/2012 0858   APPEARANCEUR Hazy 06/23/2012 0858   LABSPEC 1.024 06/23/2012 0858   PHURINE 5.0 06/23/2012 0858   GLUCOSEU Negative 06/23/2012 0858   HGBUR Negative 06/23/2012 0858   BILIRUBINUR Negative 06/23/2012 0858   KETONESUR Negative  06/23/2012 0858   PROTEINUR 30 mg/dL 06/23/2012 0858   NITRITE Negative 06/23/2012 0858   LEUKOCYTESUR Negative 06/23/2012 0858    Radiological Exams on Admission: DG Chest 2 View  Result Date: 09/02/2019 CLINICAL DATA:  Chest pain EXAM: CHEST - 2 VIEW COMPARISON:  Radiograph 12/21/2017 FINDINGS: No consolidation, features of edema, pneumothorax, or effusion. Pulmonary vascularity is normally distributed. The cardiomediastinal contours are unremarkable. No acute osseous or soft tissue abnormality. IMPRESSION: No acute cardiopulmonary abnormality. Electronically Signed   By: Lovena Le M.D.   On: 09/02/2019 06:51   CT Angio Chest PE W and/or Wo Contrast  Result Date: 09/02/2019 CLINICAL DATA:  Shortness of breath with wheezing and productive cough for 2 weeks. EXAM: CT ANGIOGRAPHY CHEST WITH CONTRAST TECHNIQUE: Multidetector CT imaging of the chest was performed using the standard protocol during bolus administration of intravenous contrast. Multiplanar CT image reconstructions and MIPs were obtained to evaluate the vascular anatomy. CONTRAST:  15mL OMNIPAQUE IOHEXOL 350 MG/ML SOLN COMPARISON:  Radiographs 09/02/2019 and 12/21/2017. Abdominal CT 06/23/2012. FINDINGS: Cardiovascular: The pulmonary arteries are suboptimally opacified with contrast to the level of the segmental branches. There is no evidence of acute pulmonary embolism. No systemic arterial abnormalities are seen. The heart size is normal. There is no pericardial effusion. Mediastinum/Nodes: There are no enlarged mediastinal, hilar or axillary lymph nodes.Small subcarinal lymph nodes do not appear significantly enlarged. The thyroid gland, trachea and esophagus demonstrate no significant findings. Lungs/Pleura: No pleural effusion or pneumothorax. There are patchy ground-glass opacities at the left lung apex and to a lesser degree in the right lower lobe adjacent to the confluence of the fissures. There is no confluent airspace opacity or  suspicious pulmonary nodule. Upper abdomen: The hepatic density is diffusely decreased, consistent with steatosis. No acute findings within the visualized upper abdomen. Musculoskeletal/Chest wall: There is no chest wall mass or suspicious osseous finding. Review of the MIP images confirms the above findings. IMPRESSION: 1. No evidence of acute pulmonary embolism or other acute vascular findings. 2. Patchy ground-glass opacities in the left upper and right lower lobes, likely inflammatory/infectious. Consider viral pneumonia. No confluent airspace opacity or suspicious pulmonary nodule. 3. Hepatic steatosis. Electronically Signed   By: Richardean Sale M.D.   On: 09/02/2019 11:22    EKG: Independently reviewed.  Sinus tachycardia  Assessment/Plan Active Problems:   Asthma, chronic, unspecified asthma severity, with acute exacerbation   Obesity, Class III, BMI 40-49.9 (morbid obesity) (HCC)   Nicotine dependence   Acute asthma exacerbation Most likely triggered by viral illness Place patient on scheduled and as needed bronchodilator therapy as well as systemic steroids   Viral pneumonia Etiology unclear Patient's SARS Coronavirus 2 test is negative Continue supportive care with systemic steroids and oxygen supplementation as needed   Morbid obesity Complicates overall prognosis and care   Nicotine dependence Smoking cessation was discussed with patient in detail She declines a nicotine transdermal patch at this time   Diabetes mellitus Maintain consistent carbohydrate diet Sliding scale insulin coverage  DVT prophylaxis: Lovenox Code Status: Full code Family Communication: Greater than 50% of time was spent discussing plan of care with patient at the bedside, she verbalizes understanding and agrees with the plan. Disposition Plan: Back to previous home environment Consults called: None    Karlis Cregg MD [facility]     09/02/2019, 3:17 PM

## 2019-09-02 NOTE — ED Notes (Signed)
CBG checked by this RN, duoneb treatment administered by this RN due to patient c/o feeling increased SOB. Pt voiced concerns regarding frequency of breathing treatments. This RN messaged admitting MD regarding patient concerns. Awaiting to hear back from admitting MD. Pt currently doing breathing treatment at this time.

## 2019-09-03 DIAGNOSIS — Z803 Family history of malignant neoplasm of breast: Secondary | ICD-10-CM | POA: Diagnosis not present

## 2019-09-03 DIAGNOSIS — F17210 Nicotine dependence, cigarettes, uncomplicated: Secondary | ICD-10-CM | POA: Diagnosis present

## 2019-09-03 DIAGNOSIS — Z20822 Contact with and (suspected) exposure to covid-19: Secondary | ICD-10-CM | POA: Diagnosis present

## 2019-09-03 DIAGNOSIS — J129 Viral pneumonia, unspecified: Secondary | ICD-10-CM | POA: Diagnosis present

## 2019-09-03 DIAGNOSIS — Z833 Family history of diabetes mellitus: Secondary | ICD-10-CM | POA: Diagnosis not present

## 2019-09-03 DIAGNOSIS — J302 Other seasonal allergic rhinitis: Secondary | ICD-10-CM | POA: Diagnosis present

## 2019-09-03 DIAGNOSIS — F17200 Nicotine dependence, unspecified, uncomplicated: Secondary | ICD-10-CM

## 2019-09-03 DIAGNOSIS — Z79899 Other long term (current) drug therapy: Secondary | ICD-10-CM | POA: Diagnosis not present

## 2019-09-03 DIAGNOSIS — Z6841 Body Mass Index (BMI) 40.0 and over, adult: Secondary | ICD-10-CM | POA: Diagnosis not present

## 2019-09-03 DIAGNOSIS — R7989 Other specified abnormal findings of blood chemistry: Secondary | ICD-10-CM | POA: Diagnosis present

## 2019-09-03 DIAGNOSIS — G43109 Migraine with aura, not intractable, without status migrainosus: Secondary | ICD-10-CM | POA: Diagnosis present

## 2019-09-03 DIAGNOSIS — J45901 Unspecified asthma with (acute) exacerbation: Principal | ICD-10-CM

## 2019-09-03 DIAGNOSIS — D649 Anemia, unspecified: Secondary | ICD-10-CM | POA: Diagnosis present

## 2019-09-03 DIAGNOSIS — Z7951 Long term (current) use of inhaled steroids: Secondary | ICD-10-CM | POA: Diagnosis not present

## 2019-09-03 DIAGNOSIS — E119 Type 2 diabetes mellitus without complications: Secondary | ICD-10-CM

## 2019-09-03 DIAGNOSIS — Z7984 Long term (current) use of oral hypoglycemic drugs: Secondary | ICD-10-CM | POA: Diagnosis not present

## 2019-09-03 LAB — CBC
HCT: 35.3 % — ABNORMAL LOW (ref 36.0–46.0)
Hemoglobin: 12.4 g/dL (ref 12.0–15.0)
MCH: 28.7 pg (ref 26.0–34.0)
MCHC: 35.1 g/dL (ref 30.0–36.0)
MCV: 81.7 fL (ref 80.0–100.0)
Platelets: 322 10*3/uL (ref 150–400)
RBC: 4.32 MIL/uL (ref 3.87–5.11)
RDW: 14.4 % (ref 11.5–15.5)
WBC: 15.4 10*3/uL — ABNORMAL HIGH (ref 4.0–10.5)
nRBC: 0 % (ref 0.0–0.2)

## 2019-09-03 LAB — LACTIC ACID, PLASMA: Lactic Acid, Venous: 1.8 mmol/L (ref 0.5–1.9)

## 2019-09-03 LAB — GLUCOSE, CAPILLARY
Glucose-Capillary: 189 mg/dL — ABNORMAL HIGH (ref 70–99)
Glucose-Capillary: 222 mg/dL — ABNORMAL HIGH (ref 70–99)
Glucose-Capillary: 150 mg/dL — ABNORMAL HIGH (ref 70–99)
Glucose-Capillary: 181 mg/dL — ABNORMAL HIGH (ref 70–99)

## 2019-09-03 LAB — BASIC METABOLIC PANEL
Anion gap: 5 (ref 5–15)
BUN: 9 mg/dL (ref 6–20)
CO2: 19 mmol/L — ABNORMAL LOW (ref 22–32)
Calcium: 8.8 mg/dL — ABNORMAL LOW (ref 8.9–10.3)
Chloride: 115 mmol/L — ABNORMAL HIGH (ref 98–111)
Creatinine, Ser: 0.67 mg/dL (ref 0.44–1.00)
GFR calc Af Amer: >60 mL/min (ref >60)
GFR calc non Af Amer: >60 mL/min (ref >60)
Glucose, Bld: 181 mg/dL — ABNORMAL HIGH (ref 70–99)
Potassium: 4.2 mmol/L (ref 3.5–5.1)
Sodium: 139 mmol/L (ref 135–145)

## 2019-09-03 MED ORDER — AZITHROMYCIN 250 MG PO TABS
250.0000 mg | ORAL_TABLET | Freq: Every day | ORAL | Status: DC
  Administered 2019-09-04: 250 mg via ORAL
  Filled 2019-09-03: qty 1

## 2019-09-03 MED ORDER — AZITHROMYCIN 250 MG PO TABS
500.0000 mg | ORAL_TABLET | Freq: Every day | ORAL | Status: AC
  Administered 2019-09-03: 500 mg via ORAL
  Filled 2019-09-03: qty 2

## 2019-09-03 NOTE — Progress Notes (Signed)
Inpatient Diabetes Program Recommendations  AACE/ADA: New Consensus Statement on Inpatient Glycemic Control (2015)  Target Ranges:  Prepandial:   less than 140 mg/dL      Peak postprandial:   less than 180 mg/dL (1-2 hours)      Critically ill patients:  140 - 180 mg/dL   Lab Results  Component Value Date   GLUCAP 222 (H) 09/03/2019   HGBA1C 4.6 05/04/2012    Review of Glycemic Control Results for [name], Amanda [name] (MRN [id number]) as of 09/03/2019 15:29  Ref. Range 09/02/2019 17:25 09/03/2019 08:38 09/03/2019 12:45  Glucose-Capillary Latest Ref Range: 70 - 99 mg/dL 314 (H) 970 (H) 263 (H)   Diabetes history: DM 2 Outpatient Diabetes medications:  Metformin 1000 mg bid Current orders for Inpatient glycemic control:  Novolog resistant tid with meals  Solumedrol 40 mg IV q 12 hours Inpatient Diabetes Program Recommendations:    May consider adding Novolog meal coverage 3 units tid with meals (hold if patient eats less than 50%) while patient is on steroids.  Thanks,  Beryl Meager, RN, BC-ADM Inpatient Diabetes Coordinator Pager [phone] (8a-5p)

## 2019-09-03 NOTE — ED Notes (Signed)
Pt with room air O2 sats 88-90% sitting in bed. Pt without acute distress. Pt placed on 2LNC. See flowsheet

## 2019-09-03 NOTE — ED Notes (Signed)
Pt sleeping. 

## 2019-09-03 NOTE — ED Notes (Signed)
Report received from Amy RN, pt resting comfortably with eyes closed. NO shob noted at this time, no co pain.

## 2019-09-03 NOTE — ED Notes (Signed)
Pt woke up coughing and wheezing.  Breathing treatment given.

## 2019-09-03 NOTE — ED Notes (Signed)
Pt awake watching tv.  Iv fluids infusing

## 2019-09-03 NOTE — Progress Notes (Signed)
PROGRESS NOTE    Amanda [name]  ZOX:[id number] DOB: Jun 03, 1985 DOA: 09/02/2019 PCP: Patient, No Pcp Per   Brief Narrative:  Amanda [name] is a 34 y.o. female with medical history significant for asthma, nicotine dependence, morbid obesity who presented to the emergency room for evaluation of worsening shortness of breath over the last 24 hours.  Patient states that for the last 2 weeks she has had chest congestion as well as a cough productive of occasional yellow/green phlegm as well as wheezing.  She was treated with systemic steroids and antibiotic therapy with some improvement in her symptoms until last night when she developed shortness of breath that did not improve following the use of her inhaler or nebulizer treatment.  Early this morning she woke up and was unable to catch her breath so she came to the emergency room.  She received nebulizer treatment, IV magnesium and systemic steroids. She denies having any fever or chills.  Denies having any sick contacts.  She denies having any nausea, vomiting, abdominal pain or changes in her bowel habits.  She is not vaccinated and has not had Covid 19 viral infection infection.  Her daughter has been sick with similar symptoms. She had a chest x-ray which showed no acute cardiopulmonary abnormality. She had a CT angiogram of the chest which showed no evidence of acute pulmonary embolism or other acute vascular findings. Patchy ground-glass opacities in the left upper and right lower lobes, likely inflammatory/infectious. Consider viral pneumonia. No confluent airspace opacity or suspicious pulmonary nodule.Her SARS Coronavirus 2 test was negative.     Consultants:     Procedures:   Antimicrobials:      Subjective: Still sob, but mildly better than on admission.  Positive cough.  No new symptoms.  Mom at bedside  Objective: Vitals:   09/03/19 1345 09/03/19 1407 09/03/19 1631 09/03/19 1633  BP:  (!) 138/95 (!) 136/97   Pulse:  (!) 122 (!) 118 (!) 113 (!) 108  Resp: (!) 25 12 16    Temp:   98.2 F (36.8 C)   TempSrc:   Oral   SpO2: 92% 93% 97%   Weight:      Height:        Intake/Output Summary (Last 24 hours) at 09/03/2019 1807 Last data filed at 09/03/2019 1501 Gross per 24 hour  Intake 2076.96 ml  Output --  Net 2076.96 ml   Filed Weights   09/02/19 0603 09/02/19 0607  Weight: 112 kg 112 kg    Examination:  General exam: Appears calm and comfortable, becomes mildly short of breath while talking Respiratory system: Clear to auscultation. Respiratory effort normal. Cardiovascular system: S1 & S2 heard, RRR. No JVD, murmurs, rubs, gallops or clicks.  Gastrointestinal system: Abdomen is nondistended, soft and nontender. Normal bowel sounds heard. Central nervous system: Alert and oriented. No focal neurological deficits. Extremities: No edema Skin: Warm dry Psychiatry: Judgement and insight appear normal. Mood & affect appropriate.     Data Reviewed: I have personally reviewed following labs and imaging studies  CBC: Recent Labs  Lab 09/02/19 0618 09/03/19 0520  WBC 14.2* 15.4*  NEUTROABS 11.0*  --   HGB 13.3 12.4  HCT 39.5 35.3*  MCV 84.4 81.7  PLT 327 322   Basic Metabolic Panel: Recent Labs  Lab 09/02/19 0618 09/03/19 0520  NA 140 139  K 4.1 4.2  CL 113* 115*  CO2 19* 19*  GLUCOSE 147* 181*  BUN 9 9  CREATININE 0.95 0.67  CALCIUM 9.0 8.8*   GFR: Estimated Creatinine Clearance: 122.4 mL/min (by C-G formula based on SCr of 0.67 mg/dL). Liver Function Tests: Recent Labs  Lab 09/02/19 0618  AST 17  ALT 20  ALKPHOS 92  BILITOT 0.5  PROT 7.4  ALBUMIN 3.8   No results for input(s): LIPASE, AMYLASE in the last 168 hours. No results for input(s): AMMONIA in the last 168 hours. Coagulation Profile: No results for input(s): INR, PROTIME in the last 168 hours. Cardiac Enzymes: No results for input(s): CKTOTAL, CKMB, CKMBINDEX, TROPONINI in the last 168 hours. BNP (last 3  results) No results for input(s): PROBNP in the last 8760 hours. HbA1C: No results for input(s): HGBA1C in the last 72 hours. CBG: Recent Labs  Lab 09/02/19 1725 09/03/19 0838 09/03/19 1245 09/03/19 1628  GLUCAP 238* 189* 222* 150*   Lipid Profile: No results for input(s): CHOL, HDL, LDLCALC, TRIG, CHOLHDL, LDLDIRECT in the last 72 hours. Thyroid Function Tests: No results for input(s): TSH, T4TOTAL, FREET4, T3FREE, THYROIDAB in the last 72 hours. Anemia Panel: No results for input(s): VITAMINB12, FOLATE, FERRITIN, TIBC, IRON, RETICCTPCT in the last 72 hours. Sepsis Labs: Recent Labs  Lab 09/02/19 1251 09/02/19 1429  LATICACIDVEN 2.5* 2.5*    Recent Results (from the past 240 hour(s))  SARS Coronavirus 2 by RT PCR (hospital order, performed in [facility] hospital lab) Nasopharyngeal Nasopharyngeal Swab     Status: None   Collection Time: 09/02/19  8:40 AM   Specimen: Nasopharyngeal Swab  Result Value Ref Range Status   SARS Coronavirus 2 NEGATIVE NEGATIVE Final    Comment: (NOTE) SARS-CoV-2 target nucleic acids are NOT DETECTED.  The SARS-CoV-2 RNA is generally detectable in upper and lower respiratory specimens during the acute phase of infection. The lowest concentration of SARS-CoV-2 viral copies this assay can detect is 250 copies / mL. A negative result does not preclude SARS-CoV-2 infection and should not be used as the sole basis for treatment or other patient management decisions.  A negative result may occur with improper specimen collection / handling, submission of specimen other than nasopharyngeal swab, presence of viral mutation(s) within the areas targeted by this assay, and inadequate number of viral copies (<250 copies / mL). A negative result must be combined with clinical observations, patient history, and epidemiological information.  Fact Sheet for Patients:   BoilerBrush.com.cy  Fact Sheet for Healthcare  Providers: https://pope.com/  This test is not yet approved or  cleared by the [place] FDA and has been authorized for detection and/or diagnosis of SARS-CoV-2 by FDA under an Emergency Use Authorization (EUA).  This EUA will remain in effect (meaning this test can be used) for the duration of the COVID-19 declaration under Section 564(b)(1) of the Act, 21 U.S.C. section 360bbb-3(b)(1), unless the authorization is terminated or revoked sooner.  Performed at [facility], [address] [place], [place] [address]   Blood culture (routine x 2)     Status: None (Preliminary result)   Collection Time: 09/02/19 12:51 PM   Specimen: BLOOD  Result Value Ref Range Status   Specimen Description BLOOD BLOOD RIGHT HAND  Final   Special Requests   Final    BOTTLES DRAWN AEROBIC AND ANAEROBIC Blood Culture adequate volume   Culture   Final    NO GROWTH < 24 HOURS Performed at [facility], [address], [place], [place] [address]    Report Status PENDING  Incomplete  Blood culture (routine x 2)  Status: None (Preliminary result)   Collection Time: 09/02/19 12:51 PM   Specimen: BLOOD  Result Value Ref Range Status   Specimen Description BLOOD BLOOD RIGHT FOREARM  Final   Special Requests   Final    BOTTLES DRAWN AEROBIC AND ANAEROBIC Blood Culture adequate volume   Culture   Final    NO GROWTH < 24 HOURS Performed at [facility], [address], [place], [place] [address]    Report Status PENDING  Incomplete         Radiology Studies: DG Chest 2 View  Result Date: 09/02/2019 CLINICAL DATA:  Chest pain EXAM: CHEST - 2 VIEW COMPARISON:  Radiograph 12/21/2017 FINDINGS: No consolidation, features of edema, pneumothorax, or effusion. Pulmonary vascularity is normally distributed. The cardiomediastinal contours are unremarkable. No acute osseous or soft tissue abnormality. IMPRESSION: No acute cardiopulmonary abnormality.  Electronically Signed   By: Kreg Shropshire M.D.   On: 09/02/2019 06:51   CT Angio Chest PE W and/or Wo Contrast  Result Date: 09/02/2019 CLINICAL DATA:  Shortness of breath with wheezing and productive cough for 2 weeks. EXAM: CT ANGIOGRAPHY CHEST WITH CONTRAST TECHNIQUE: Multidetector CT imaging of the chest was performed using the standard protocol during bolus administration of intravenous contrast. Multiplanar CT image reconstructions and MIPs were obtained to evaluate the vascular anatomy. CONTRAST:  72mL OMNIPAQUE IOHEXOL 350 MG/ML SOLN COMPARISON:  Radiographs 09/02/2019 and 12/21/2017. Abdominal CT 06/23/2012. FINDINGS: Cardiovascular: The pulmonary arteries are suboptimally opacified with contrast to the level of the segmental branches. There is no evidence of acute pulmonary embolism. No systemic arterial abnormalities are seen. The heart size is normal. There is no pericardial effusion. Mediastinum/Nodes: There are no enlarged mediastinal, hilar or axillary lymph nodes.Small subcarinal lymph nodes do not appear significantly enlarged. The thyroid gland, trachea and esophagus demonstrate no significant findings. Lungs/Pleura: No pleural effusion or pneumothorax. There are patchy ground-glass opacities at the left lung apex and to a lesser degree in the right lower lobe adjacent to the confluence of the fissures. There is no confluent airspace opacity or suspicious pulmonary nodule. Upper abdomen: The hepatic density is diffusely decreased, consistent with steatosis. No acute findings within the visualized upper abdomen. Musculoskeletal/Chest wall: There is no chest wall mass or suspicious osseous finding. Review of the MIP images confirms the above findings. IMPRESSION: 1. No evidence of acute pulmonary embolism or other acute vascular findings. 2. Patchy ground-glass opacities in the left upper and right lower lobes, likely inflammatory/infectious. Consider viral pneumonia. No confluent airspace opacity  or suspicious pulmonary nodule. 3. Hepatic steatosis. Electronically Signed   By: Carey Bullocks M.D.   On: 09/02/2019 11:22        Scheduled Meds: . cholecalciferol  1,000 Units Oral Daily  . enoxaparin (LOVENOX) injection  40 mg Subcutaneous Q24H  . insulin aspart  0-20 Units Subcutaneous TID WC  . loratadine  10 mg Oral QPM  . methylPREDNISolone (SOLU-MEDROL) injection  40 mg Intravenous Q12H  . mometasone-formoterol  2 puff Inhalation BID   Continuous Infusions: . sodium chloride 125 mL/hr at 09/03/19 1643    Assessment & Plan:   Active Problems:   Asthma, chronic, unspecified asthma severity, with acute exacerbation   Obesity, Class III, BMI 40-49.9 (morbid obesity) (HCC)   Nicotine dependence   Acute asthma exacerbation   Type 2 diabetes mellitus without complication (HCC)   Acute asthma exacerbation Most likely triggered by viral illness Place patient on scheduled and as needed bronchodilator therapy Continue iv systemic steroids  add zpack for possible component of bronichitis  Viral pneumonia Etiology unclear Patient's SARS Coronavirus 2 test is negative Continue supportive care with systemic steroids and oxygen supplementation as needed   Morbid obesity Complicates overall prognosis and care   Nicotine dependence Counseled about smoking cessation She declines a nicotine transdermal patch at this time   Diabetes mellitus Maintain consistent carbohydrate diet Sliding scale insulin coverage  DVT prophylaxis: Lovenox Code Status: Full code Family Communication:  Mom at bedside  disposition Plan: Back to previous home environment Barrier: Still requiring IV steroids as she is very symptomatic and not medically stable Anticipated discharge: likely 2 more days       LOS: 0 days   Time spent: 45 minutes with more than 50% spent on [place], MD [facility] Pager [phone]  If 7PM-7AM, please contact  night-coverage [email or web address] Password [facility] 09/03/2019, 6:07 PM

## 2019-09-04 LAB — HEMOGLOBIN A1C
Hgb A1c MFr Bld: 6 % — ABNORMAL HIGH (ref 4.8–5.6)
Mean Plasma Glucose: 126 mg/dL

## 2019-09-04 LAB — GLUCOSE, CAPILLARY: Glucose-Capillary: 178 mg/dL — ABNORMAL HIGH (ref 70–99)

## 2019-09-04 MED ORDER — PREDNISONE 20 MG PO TABS: 40.0000 mg | ORAL_TABLET | Freq: Every day | ORAL | Status: DC

## 2019-09-04 MED ORDER — PANTOPRAZOLE SODIUM 20 MG PO TBEC
20.0000 mg | DELAYED_RELEASE_TABLET | Freq: Every day | ORAL | 0 refills | Status: DC
Start: 2019-09-04 — End: 2021-03-29

## 2019-09-04 MED ORDER — LORATADINE 10 MG PO TABS: 10.0000 mg | ORAL_TABLET | Freq: Every evening | ORAL | 0 refills | Status: DC

## 2019-09-04 MED ORDER — PREDNISONE 20 MG PO TABS
20.0000 mg | ORAL_TABLET | Freq: Every day | ORAL | 0 refills | Status: AC
Start: 2019-09-07 — End: 2019-09-09

## 2019-09-04 MED ORDER — PREDNISONE 20 MG PO TABS: 40.0000 mg | ORAL_TABLET | Freq: Every day | ORAL | 0 refills | Status: AC

## 2019-09-04 MED ORDER — AZITHROMYCIN 250 MG PO TABS
ORAL_TABLET | ORAL | 0 refills | Status: AC
Start: 2019-09-04 — End: 2019-09-09

## 2019-09-04 NOTE — Plan of Care (Signed)
Patient to be discharged today

## 2019-09-04 NOTE — Plan of Care (Signed)
Discharge teaching completed with patient who verbalized understanding of teaching. Patient is in stable condition.

## 2019-09-04 NOTE — Discharge Summary (Signed)
Amanda CaldronLorena Ann Purpura GNF:[id number] DOB: [birth year] DOA: 09/02/2019  PCP: Patient, No Pcp Per  Admit date: 09/02/2019 Discharge date: 09/04/2019  Admitted From: Home Disposition: Home  Recommendations for Outpatient Follow-up:  1. Follow up with PCP in 1 week 2. Please obtain BMP/CBC in one week 3. Follow-up with pulmonary Dr.Aleskerove in one week     Discharge Condition:Stable CODE STATUS:full  Diet recommendation: Carb Modified  Brief/Interim Summary: Amanda [name] is a 34 y.o. female with medical history significant for asthma, nicotine dependence, morbid obesity who presented to the emergency room for evaluation of worsening shortness of breath over the last 24 hours.  Patient states that for the last 2 weeks she has had chest congestion as well as a cough productive of occasional yellow/green phlegm as well as wheezing.  She was treated with systemic steroids and antibiotic therapy with some improvement in her symptoms until last night when she developed shortness of breath that did not improve following the use of her inhaler or nebulizer treatment.  Patient woke up and was unable to catch her breath so she came to the emergency room.  She received nebulizer treatment, IV magnesium and systemic steroids.chest x-ray which showed no acute cardiopulmonary abnormality.CT scan as stated below.  Acute asthma exacerbation Most likely triggered by viral illness Improved, at baseline today. switch iv systemic steroids to po taper zpack for possible component of bronichitis to continue as outpt. I let Dr. Karna ChristmasAleskerov know to have patient f/u pccm as outpt. He will make an appointment for f/u .  Viral pneumonia Etiology unclear Patient'sSARS Coronavirus 2test is negative Continue supportive care   Morbid obesity Complicates overall prognosis and care   Nicotine dependence Patient was counseled smoking cessation She declined a nicotine transdermal patch    Diabetes  mellitus Maintain consistent carbohydrate diet Resume home meds on discharge  Discharge Diagnoses:  Active Problems:   Asthma, chronic, unspecified asthma severity, with acute exacerbation   Obesity, Class III, BMI 40-49.9 (morbid obesity) (HCC)   Nicotine dependence   Acute asthma exacerbation   Type 2 diabetes mellitus without complication [facility](HCC)    Discharge Instructions  Discharge Instructions    Call MD for:  temperature >100.4   Complete by: As directed    Diet - low sodium heart healthy   Complete by: As directed    diabetic   Increase activity slowly   Complete by: As directed      Allergies as of 09/04/2019      Reactions   Charletta CousinBirch Tar Oil  [betula Alba Oil] Anaphylaxis, Itching, Rash, Shortness Of Breath, Swelling   Other Anaphylaxis, Itching, Rash, Shortness Of Breath, Swelling   Beclomethasone Itching   Cefaclor Rash, Other (See Comments)      Medication List    TAKE these medications   acetaminophen 500 MG tablet Commonly known as: TYLENOL Take 500 mg by mouth every 6 (six) hours as needed.   Advair HFA 115-21 MCG/ACT inhaler Generic drug: fluticasone-salmeterol Inhale 2 puffs into the lungs 2 (two) times daily. INHALE 2 PUFFS BY MOUTH EVERY 12 HOURS   azithromycin 250 MG tablet Commonly known as: Zithromax Z-Pak Take 2 tablets (500 mg) on  Day 1,  followed by 1 tablet (250 mg) once daily on Days 2 through 5.   cholecalciferol 25 MCG (1000 UNIT) tablet Commonly known as: VITAMIN D Take 1,000 Units by mouth daily.   levocetirizine 5 MG tablet Commonly known as: XYZAL Take 5 mg by mouth every evening.   loratadine  10 MG tablet Commonly known as: CLARITIN Take 1 tablet (10 mg total) by mouth every evening.   metFORMIN 1000 MG tablet Commonly known as: GLUCOPHAGE Take 1,000 mg by mouth 2 (two) times daily with a meal.   pantoprazole 20 MG tablet Commonly known as: Protonix Take 1 tablet (20 mg total) by mouth daily.   predniSONE 20 MG  tablet Commonly known as: DELTASONE Take 2 tablets (40 mg total) by mouth daily with breakfast for 2 days. Start taking on: September 05, 2019   predniSONE 20 MG tablet Commonly known as: DELTASONE Take 1 tablet (20 mg total) by mouth daily for 2 days. Start taking on: September 07, 2019   SUMAtriptan 50 MG tablet Commonly known as: IMITREX Take 1 tablet by mouth at the onset of your headache, may repeat in 2 hours.   topiramate 25 MG tablet Commonly known as: TOPAMAX Take by mouth.   Ventolin HFA 108 (90 Base) MCG/ACT inhaler Generic drug: albuterol       Follow-up Information    Vida Rigger, MD. Go on 09/11/2019.   Specialty: Pulmonary Disease Why: 1am appointment Contact information: [address] [place] [address] [phone]              Allergies  Allergen Reactions  . Birch Tar Oil  [Betula Alba Oil] Anaphylaxis, Itching, Rash, Shortness Of Breath and Swelling  . Other Anaphylaxis, Itching, Rash, Shortness Of Breath and Swelling  . Beclomethasone Itching  . Cefaclor Rash and Other (See Comments)         Procedures/Studies: DG Chest 2 View  Result Date: 09/02/2019 CLINICAL DATA:  Chest pain EXAM: CHEST - 2 VIEW COMPARISON:  Radiograph 12/21/2017 FINDINGS: No consolidation, features of edema, pneumothorax, or effusion. Pulmonary vascularity is normally distributed. The cardiomediastinal contours are unremarkable. No acute osseous or soft tissue abnormality. IMPRESSION: No acute cardiopulmonary abnormality. Electronically Signed   By: Kreg Shropshire M.D.   On: 09/02/2019 06:51   CT Angio Chest PE W and/or Wo Contrast  Result Date: 09/02/2019 CLINICAL DATA:  Shortness of breath with wheezing and productive cough for 2 weeks. EXAM: CT ANGIOGRAPHY CHEST WITH CONTRAST TECHNIQUE: Multidetector CT imaging of the chest was performed using the standard protocol during bolus administration of intravenous contrast. Multiplanar CT image reconstructions and MIPs  were obtained to evaluate the vascular anatomy. CONTRAST:  37mL OMNIPAQUE IOHEXOL 350 MG/ML SOLN COMPARISON:  Radiographs 09/02/2019 and 12/21/2017. Abdominal CT 06/23/2012. FINDINGS: Cardiovascular: The pulmonary arteries are suboptimally opacified with contrast to the level of the segmental branches. There is no evidence of acute pulmonary embolism. No systemic arterial abnormalities are seen. The heart size is normal. There is no pericardial effusion. Mediastinum/Nodes: There are no enlarged mediastinal, hilar or axillary lymph nodes.Small subcarinal lymph nodes do not appear significantly enlarged. The thyroid gland, trachea and esophagus demonstrate no significant findings. Lungs/Pleura: No pleural effusion or pneumothorax. There are patchy ground-glass opacities at the left lung apex and to a lesser degree in the right lower lobe adjacent to the confluence of the fissures. There is no confluent airspace opacity or suspicious pulmonary nodule. Upper abdomen: The hepatic density is diffusely decreased, consistent with steatosis. No acute findings within the visualized upper abdomen. Musculoskeletal/Chest wall: There is no chest wall mass or suspicious osseous finding. Review of the MIP images confirms the above findings. IMPRESSION: 1. No evidence of acute pulmonary embolism or other acute vascular findings. 2. Patchy ground-glass opacities in the left upper and right lower lobes, likely inflammatory/infectious.  Consider viral pneumonia. No confluent airspace opacity or suspicious pulmonary nodule. 3. Hepatic steatosis. Electronically Signed   By: Richardean Sale M.D.   On: 09/02/2019 11:22       Subjective: Feels better.  Feels shortness of breath at baseline.  Discharge Exam: Vitals:   09/04/19 0424 09/04/19 0806  BP: (!) 143/91 (!) 140/107  Pulse: 70 79  Resp: 17 18  Temp: 97.6 F (36.4 C) 98.5 F (36.9 C)  SpO2: 98% 96%   Vitals:   09/03/19 2000 09/03/19 2341 09/04/19 0424 09/04/19 0806   BP: (!) 139/99 (!) 134/96 (!) 143/91 (!) 140/107  Pulse: 86 70 70 79  Resp: 16 17 17 18   Temp: 97.8 F (36.6 C) (!) 97.5 F (36.4 C) 97.6 F (36.4 C) 98.5 F (36.9 C)  TempSrc: Oral Oral Oral Oral  SpO2: 97% 98% 98% 96%  Weight:      Height:        General: Pt is alert, awake, not in acute distress, nontachypneic Cardiovascular: RRR, S1/S2 +, no rubs, no gallops Respiratory: CTA bilaterally, no wheezing, no rhonchi Abdominal: Soft, NT, ND, bowel sounds + Extremities: no edema, no cyanosis    The results of significant diagnostics from this hospitalization (including imaging, microbiology, ancillary and laboratory) are listed below for reference.     Microbiology: Recent Results (from the past 240 hour(s))  SARS Coronavirus 2 by RT PCR (hospital order, performed in [facility] hospital lab) Nasopharyngeal Nasopharyngeal Swab     Status: None   Collection Time: 09/02/19  8:40 AM   Specimen: Nasopharyngeal Swab  Result Value Ref Range Status   SARS Coronavirus 2 NEGATIVE NEGATIVE Final    Comment: (NOTE) SARS-CoV-2 target nucleic acids are NOT DETECTED.  The SARS-CoV-2 RNA is generally detectable in upper and lower respiratory specimens during the acute phase of infection. The lowest concentration of SARS-CoV-2 viral copies this assay can detect is 250 copies / mL. A negative result does not preclude SARS-CoV-2 infection and should not be used as the sole basis for treatment or other patient management decisions.  A negative result may occur with improper specimen collection / handling, submission of specimen other than nasopharyngeal swab, presence of viral mutation(s) within the areas targeted by this assay, and inadequate number of viral copies (<250 copies / mL). A negative result must be combined with clinical observations, patient history, and epidemiological information.  Fact Sheet for Patients:   StrictlyIdeas.no  Fact Sheet for  Healthcare Providers: BankingDealers.co.za  This test is not yet approved or  cleared by the [place] FDA and has been authorized for detection and/or diagnosis of SARS-CoV-2 by FDA under an Emergency Use Authorization (EUA).  This EUA will remain in effect (meaning this test can be used) for the duration of the COVID-19 declaration under Section 564(b)(1) of the Act, 21 U.S.C. section 360bbb-3(b)(1), unless the authorization is terminated or revoked sooner.  Performed at [facility], [address] [place], [place] [address]   Blood culture (routine x 2)     Status: None (Preliminary result)   Collection Time: 09/02/19 12:51 PM   Specimen: BLOOD  Result Value Ref Range Status   Specimen Description BLOOD BLOOD RIGHT HAND  Final   Special Requests   Final    BOTTLES DRAWN AEROBIC AND ANAEROBIC Blood Culture adequate volume   Culture   Final    NO GROWTH 2 DAYS Performed at [facility], [address] [place], [place] [address]  Report Status PENDING  Incomplete  Blood culture (routine x 2)     Status: None (Preliminary result)   Collection Time: 09/02/19 12:51 PM   Specimen: BLOOD  Result Value Ref Range Status   Specimen Description BLOOD BLOOD RIGHT FOREARM  Final   Special Requests   Final    BOTTLES DRAWN AEROBIC AND ANAEROBIC Blood Culture adequate volume   Culture   Final    NO GROWTH 2 DAYS Performed at [facility], [address] [place], [place] [address]    Report Status PENDING  Incomplete     Labs: BNP (last 3 results) No results for input(s): BNP in the last 8760 hours. Basic Metabolic Panel: Recent Labs  Lab 09/02/19 0618 09/03/19 0520  NA 140 139  K 4.1 4.2  CL 113* 115*  CO2 19* 19*  GLUCOSE 147* 181*  BUN 9 9  CREATININE 0.95 0.67  CALCIUM 9.0 8.8*   Liver Function Tests: Recent Labs  Lab 09/02/19 0618  AST 17  ALT 20  ALKPHOS 92  BILITOT 0.5  PROT 7.4  ALBUMIN 3.8   No  results for input(s): LIPASE, AMYLASE in the last 168 hours. No results for input(s): AMMONIA in the last 168 hours. CBC: Recent Labs  Lab 09/02/19 0618 09/03/19 0520  WBC 14.2* 15.4*  NEUTROABS 11.0*  --   HGB 13.3 12.4  HCT 39.5 35.3*  MCV 84.4 81.7  PLT 327 322   Cardiac Enzymes: No results for input(s): CKTOTAL, CKMB, CKMBINDEX, TROPONINI in the last 168 hours. BNP: Invalid input(s): POCBNP CBG: Recent Labs  Lab 09/03/19 0838 09/03/19 1245 09/03/19 1628 09/03/19 2115 09/04/19 0756  GLUCAP 189* 222* 150* 181* 178*   D-Dimer No results for input(s): DDIMER in the last 72 hours. Hgb A1c Recent Labs    09/02/19 1525  HGBA1C 6.0*   Lipid Profile No results for input(s): CHOL, HDL, LDLCALC, TRIG, CHOLHDL, LDLDIRECT in the last 72 hours. Thyroid function studies No results for input(s): TSH, T4TOTAL, T3FREE, THYROIDAB in the last 72 hours.  Invalid input(s): FREET3 Anemia work up No results for input(s): VITAMINB12, FOLATE, FERRITIN, TIBC, IRON, RETICCTPCT in the last 72 hours. Urinalysis    Component Value Date/Time   COLORURINE Yellow 06/23/2012 0858   APPEARANCEUR Hazy 06/23/2012 0858   LABSPEC 1.024 06/23/2012 0858   PHURINE 5.0 06/23/2012 0858   GLUCOSEU Negative 06/23/2012 0858   HGBUR Negative 06/23/2012 0858   BILIRUBINUR Negative 06/23/2012 0858   KETONESUR Negative 06/23/2012 0858   PROTEINUR 30 mg/dL 60/45/4098 1191   NITRITE Negative 06/23/2012 0858   LEUKOCYTESUR Negative 06/23/2012 0858   Sepsis Labs Invalid input(s): PROCALCITONIN,  WBC,  LACTICIDVEN Microbiology Recent Results (from the past 240 hour(s))  SARS Coronavirus 2 by RT PCR (hospital order, performed in [facility] hospital lab) Nasopharyngeal Nasopharyngeal Swab     Status: None   Collection Time: 09/02/19  8:40 AM   Specimen: Nasopharyngeal Swab  Result Value Ref Range Status   SARS Coronavirus 2 NEGATIVE NEGATIVE Final    Comment: (NOTE) SARS-CoV-2 target nucleic acids are  NOT DETECTED.  The SARS-CoV-2 RNA is generally detectable in upper and lower respiratory specimens during the acute phase of infection. The lowest concentration of SARS-CoV-2 viral copies this assay can detect is 250 copies / mL. A negative result does not preclude SARS-CoV-2 infection and should not be used as the sole basis for treatment or other patient management decisions.  A negative result may occur with improper specimen collection /  handling, submission of specimen other than nasopharyngeal swab, presence of viral mutation(s) within the areas targeted by this assay, and inadequate number of viral copies (<250 copies / mL). A negative result must be combined with clinical observations, patient history, and epidemiological information.  Fact Sheet for Patients:   BoilerBrush.com.cy  Fact Sheet for Healthcare Providers: https://pope.com/  This test is not yet approved or  cleared by the [place] FDA and has been authorized for detection and/or diagnosis of SARS-CoV-2 by FDA under an Emergency Use Authorization (EUA).  This EUA will remain in effect (meaning this test can be used) for the duration of the COVID-19 declaration under Section 564(b)(1) of the Act, 21 U.S.C. section 360bbb-3(b)(1), unless the authorization is terminated or revoked sooner.  Performed at [facility], [address] [place], [place] [address]   Blood culture (routine x 2)     Status: None (Preliminary result)   Collection Time: 09/02/19 12:51 PM   Specimen: BLOOD  Result Value Ref Range Status   Specimen Description BLOOD BLOOD RIGHT HAND  Final   Special Requests   Final    BOTTLES DRAWN AEROBIC AND ANAEROBIC Blood Culture adequate volume   Culture   Final    NO GROWTH 2 DAYS Performed at [facility], [address], [place], [place] [address]    Report Status PENDING  Incomplete  Blood culture (routine x 2)      Status: None (Preliminary result)   Collection Time: 09/02/19 12:51 PM   Specimen: BLOOD  Result Value Ref Range Status   Specimen Description BLOOD BLOOD RIGHT FOREARM  Final   Special Requests   Final    BOTTLES DRAWN AEROBIC AND ANAEROBIC Blood Culture adequate volume   Culture   Final    NO GROWTH 2 DAYS Performed at [facility], [address], [place], [place] [address]    Report Status PENDING  Incomplete     Time coordinating discharge: Over 30 minutes  SIGNED:   Lynn Ito, MD  [facility] 09/04/2019, 11:18 AM Pager   If 7PM-7AM, please contact night-coverage [email or web address] Password [email or web address]

## 2019-09-07 LAB — CULTURE, BLOOD (ROUTINE X 2)
Culture: NO GROWTH
Culture: NO GROWTH
Special Requests: ADEQUATE
Special Requests: ADEQUATE

## 2019-09-08 DIAGNOSIS — D72823 Leukemoid reaction: Secondary | ICD-10-CM | POA: Diagnosis not present

## 2019-09-08 DIAGNOSIS — J4541 Moderate persistent asthma with (acute) exacerbation: Secondary | ICD-10-CM | POA: Diagnosis not present

## 2019-09-11 DIAGNOSIS — J4540 Moderate persistent asthma, uncomplicated: Secondary | ICD-10-CM | POA: Diagnosis not present

## 2019-10-02 DIAGNOSIS — E1165 Type 2 diabetes mellitus with hyperglycemia: Secondary | ICD-10-CM | POA: Diagnosis not present

## 2019-10-09 DIAGNOSIS — E119 Type 2 diabetes mellitus without complications: Secondary | ICD-10-CM | POA: Diagnosis not present

## 2019-10-09 DIAGNOSIS — G43709 Chronic migraine without aura, not intractable, without status migrainosus: Secondary | ICD-10-CM | POA: Diagnosis not present

## 2019-10-09 DIAGNOSIS — Z6841 Body Mass Index (BMI) 40.0 and over, adult: Secondary | ICD-10-CM | POA: Diagnosis not present

## 2020-01-01 DIAGNOSIS — E119 Type 2 diabetes mellitus without complications: Secondary | ICD-10-CM | POA: Diagnosis not present

## 2020-01-04 DIAGNOSIS — Z01818 Encounter for other preprocedural examination: Secondary | ICD-10-CM | POA: Diagnosis not present

## 2020-01-04 DIAGNOSIS — J4540 Moderate persistent asthma, uncomplicated: Secondary | ICD-10-CM | POA: Diagnosis not present

## 2020-01-08 DIAGNOSIS — E7800 Pure hypercholesterolemia, unspecified: Secondary | ICD-10-CM | POA: Diagnosis not present

## 2020-01-08 DIAGNOSIS — E669 Obesity, unspecified: Secondary | ICD-10-CM | POA: Diagnosis not present

## 2020-01-08 DIAGNOSIS — G43709 Chronic migraine without aura, not intractable, without status migrainosus: Secondary | ICD-10-CM | POA: Diagnosis not present

## 2020-01-08 DIAGNOSIS — E119 Type 2 diabetes mellitus without complications: Secondary | ICD-10-CM | POA: Diagnosis not present

## 2020-01-14 DIAGNOSIS — Z23 Encounter for immunization: Secondary | ICD-10-CM | POA: Diagnosis not present

## 2020-04-14 DIAGNOSIS — Z20822 Contact with and (suspected) exposure to covid-19: Secondary | ICD-10-CM | POA: Diagnosis not present

## 2020-04-19 DIAGNOSIS — B9689 Other specified bacterial agents as the cause of diseases classified elsewhere: Secondary | ICD-10-CM | POA: Diagnosis not present

## 2020-04-19 DIAGNOSIS — J019 Acute sinusitis, unspecified: Secondary | ICD-10-CM | POA: Diagnosis not present

## 2020-06-28 DIAGNOSIS — Z1329 Encounter for screening for other suspected endocrine disorder: Secondary | ICD-10-CM | POA: Diagnosis not present

## 2020-06-28 DIAGNOSIS — Z136 Encounter for screening for cardiovascular disorders: Secondary | ICD-10-CM | POA: Diagnosis not present

## 2020-06-28 DIAGNOSIS — Z13220 Encounter for screening for lipoid disorders: Secondary | ICD-10-CM | POA: Diagnosis not present

## 2020-06-28 DIAGNOSIS — J8283 Eosinophilic asthma: Secondary | ICD-10-CM | POA: Diagnosis not present

## 2020-07-07 DIAGNOSIS — F5109 Other insomnia not due to a substance or known physiological condition: Secondary | ICD-10-CM | POA: Diagnosis not present

## 2020-07-07 DIAGNOSIS — E119 Type 2 diabetes mellitus without complications: Secondary | ICD-10-CM | POA: Diagnosis not present

## 2020-07-07 DIAGNOSIS — E7800 Pure hypercholesterolemia, unspecified: Secondary | ICD-10-CM | POA: Diagnosis not present

## 2020-07-07 DIAGNOSIS — F418 Other specified anxiety disorders: Secondary | ICD-10-CM | POA: Diagnosis not present

## 2020-08-05 DIAGNOSIS — F5109 Other insomnia not due to a substance or known physiological condition: Secondary | ICD-10-CM | POA: Diagnosis not present

## 2020-08-05 DIAGNOSIS — F418 Other specified anxiety disorders: Secondary | ICD-10-CM | POA: Diagnosis not present

## 2020-12-28 DIAGNOSIS — J8283 Eosinophilic asthma: Secondary | ICD-10-CM | POA: Diagnosis not present

## 2020-12-28 DIAGNOSIS — J4540 Moderate persistent asthma, uncomplicated: Secondary | ICD-10-CM | POA: Diagnosis not present

## 2021-01-16 DIAGNOSIS — Z23 Encounter for immunization: Secondary | ICD-10-CM | POA: Diagnosis not present

## 2021-03-12 DIAGNOSIS — Z1371 Encounter for nonprocreative screening for genetic disease carrier status: Secondary | ICD-10-CM

## 2021-03-12 DIAGNOSIS — Z9189 Other specified personal risk factors, not elsewhere classified: Secondary | ICD-10-CM

## 2021-03-12 HISTORY — DX: Encounter for nonprocreative screening for genetic disease carrier status: Z13.71

## 2021-03-12 HISTORY — DX: Other specified personal risk factors, not elsewhere classified: Z91.89

## 2021-03-28 NOTE — Progress Notes (Signed)
PCP:  Patient, No Pcp Per (Inactive)   Chief Complaint  Patient presents with   Gynecologic Exam    Lump on RB x 1 month     HPI:      Ms. Latima Hamza is a 36 y.o. G2P1011 who LMP was Patient's last menstrual period was 03/06/2021 (exact date)., presents today for her annual examination.  Her menses are regular approx every 28-30 days, lasting 4-5 days.  Dysmenorrhea mild, improved with NSAIDs; also gets RLQ pain with menses, now monthly, was intermittently. She does not have intermenstrual bleeding.  Sex activity: single partner, contraception - IUD. Paragard placed 05/21/16. No pain/bleeding.  Last Pap: May 01, 2016  Results were: no abnormalities /neg HPV DNA  Hx of STDs: chlamydia, HPV  There is a FH of breast cancer in her pat aunt and 2 pat cousins. There is FH ovar/uterine cancer with recurrent mets in her mat cousin in her 37s.  Genetic testing not done. The patient does self-breast exams. Noticed a RT breast mass about 1 month ago, no change in size since first noticed. Has dull/throbbing pain.   Tobacco use: few cigs daily Alcohol use: social No drug use.  Exercise: not active  She does get adequate calcium and Vitamin D in her diet. Has a hx of Vit D deficiency in past.   Past Medical History:  Diagnosis Date   Asthma, allergic    Chlamydia    Diabetes mellitus without complication ([place])    Migraine with aura    Migraine without aura    Seasonal allergies      Past Surgical History:  Procedure Laterality Date   lymph node removal     Tubes in ear      Family History  Problem Relation Age of Onset   Healthy Mother    Other Father        accidental death (hit by a train)   Alcoholism Father    Diabetes Maternal Uncle    Breast cancer Paternal Aunt        32s   Diabetes Paternal Aunt    Diabetes Maternal Grandfather    Breast cancer Cousin        late 52s   Breast cancer Cousin        late 49s, triple positive   Cancer Cousin        38s,  not sure if its cervical/uterine    Social History   Socioeconomic History   Marital status: Single    Spouse name: Not on file   Number of children: Not on file   Years of education: Not on file   Highest education level: Not on file  Occupational History   Not on file  Tobacco Use   Smoking status: Some Days    Types: Cigarettes   Smokeless tobacco: Never   Tobacco comments:    occasional, socially  Vaping Use   Vaping Use: Never used  Substance and Sexual Activity   Alcohol use: Yes    Alcohol/week: 0.0 standard drinks    Comment: rarely   Drug use: No   Sexual activity: Yes    Birth control/protection: I.U.D.    Comment: Paraguard  Other Topics Concern   Not on file  Social History Narrative   Lives with her daughter   Social Determinants of Health   Financial Resource Strain: Not on file  Food Insecurity: Not on file  Transportation Needs: Not on file  Physical Activity: Not on file  Stress: Not on file  Social Connections: Not on file  Intimate Partner Violence: Not on file     Current Outpatient Medications:    acetaminophen (TYLENOL) 500 MG tablet, Take 500 mg by mouth every 6 (six) hours as needed., Disp: , Rfl:    albuterol (PROVENTIL) (2.5 MG/3ML) 0.083% nebulizer solution, Inhale into the lungs., Disp: , Rfl:    cholecalciferol (VITAMIN D) 25 MCG (1000 UNIT) tablet, Take 1,000 Units by mouth daily., Disp: , Rfl:    FLUoxetine (PROZAC) 20 MG capsule, Take 20 mg by mouth daily., Disp: , Rfl:    levocetirizine (XYZAL) 5 MG tablet, Take 5 mg by mouth every evening. , Disp: , Rfl:    montelukast (SINGULAIR) 10 MG tablet, Take 10 mg by mouth at bedtime., Disp: , Rfl:    SUMAtriptan (IMITREX) 50 MG tablet, Take 1 tablet by mouth at the onset of your headache, may repeat in 2 hours., Disp: , Rfl:    topiramate (TOPAMAX) 25 MG tablet, Take 25 mg by mouth 2 (two) times daily., Disp: , Rfl:    traZODone (DESYREL) 50 MG tablet, Take 50 mg by mouth at bedtime.,  Disp: , Rfl:    TRELEGY ELLIPTA 100-62.5-25 MCG/ACT AEPB, Inhale 1 puff into the lungs daily., Disp: , Rfl:    VENTOLIN HFA 108 (90 Base) MCG/ACT inhaler, , Disp: , Rfl:    metFORMIN (GLUCOPHAGE) 1000 MG tablet, Take 1,000 mg by mouth 2 (two) times daily with a meal. , Disp: , Rfl:   Current Facility-Administered Medications:    PARAGARD INTRAUTERINE COPPER IUD 1 Units, 1 Units, Intrauterine, Once, Latrell Potempa B, PA-C     ROS:  Review of Systems  Constitutional:  Negative for fatigue, fever and unexpected weight change.  Respiratory:  Negative for cough, shortness of breath and wheezing.   Cardiovascular:  Negative for chest pain, palpitations and leg swelling.  Gastrointestinal:  Negative for blood in stool, constipation, diarrhea, nausea and vomiting.  Endocrine: Negative for cold intolerance, heat intolerance and polyuria.  Genitourinary:  Negative for dyspareunia, dysuria, flank pain, frequency, genital sores, hematuria, menstrual problem, pelvic pain, urgency, vaginal bleeding, vaginal discharge and vaginal pain.  Musculoskeletal:  Negative for back pain, joint swelling and myalgias.  Skin:  Negative for rash.  Neurological:  Negative for dizziness, syncope, light-headedness, numbness and headaches.  Hematological:  Negative for adenopathy.  Psychiatric/Behavioral:  Negative for agitation, confusion, sleep disturbance and suicidal ideas. The patient is not nervous/anxious.   BREAST: mass   Objective: BP 114/60    Ht 5' 5"  (1.651 m)    Wt 249 lb (112.9 kg)    LMP 03/06/2021 (Exact Date)    BMI 41.44 kg/m    Physical Exam Constitutional:      Appearance: She is well-developed.  Genitourinary:     Vulva normal.     Right Labia: No rash, tenderness or lesions.    Left Labia: No tenderness, lesions or rash.    No vaginal discharge, erythema or tenderness.      Right Adnexa: not tender and no mass present.    Left Adnexa: not tender and no mass present.    No cervical  friability or polyp.     IUD strings visualized.     Uterus is not enlarged or tender.  Breasts:    Right: Mass present. No nipple discharge, skin change or tenderness.     Left: No mass, nipple discharge, skin change or tenderness.  Neck:     Thyroid: No  thyromegaly.  Cardiovascular:     Rate and Rhythm: Normal rate and regular rhythm.     Heart sounds: Normal heart sounds. No murmur heard. Pulmonary:     Effort: Pulmonary effort is normal.     Breath sounds: Normal breath sounds.  Chest:    Abdominal:     Palpations: Abdomen is soft.     Tenderness: There is no abdominal tenderness. There is no guarding or rebound.  Musculoskeletal:        General: Normal range of motion.     Cervical back: Normal range of motion.  Lymphadenopathy:     Cervical: No cervical adenopathy.  Neurological:     General: No focal deficit present.     Mental Status: She is alert and oriented to person, place, and time.     Cranial Nerves: No cranial nerve deficit.  Skin:    General: Skin is warm and dry.  Psychiatric:        Mood and Affect: Mood normal.        Behavior: Behavior normal.        Thought Content: Thought content normal.        Judgment: Judgment normal.  Vitals reviewed.    Assessment/Plan: Encounter for annual routine gynecological examination  Cervical cancer screening - Plan: Cytology - PAP  Screening for HPV (human papillomavirus) - Plan: Cytology - PAP  Encounter for routine checking of intrauterine contraceptive device (IUD); IUD strings in place; due for removal 3/28  Encounter for screening mammogram for malignant neoplasm of breast - Plan: US BREAST LTD UNI RIGHT INC AXILLA, US BREAST LTD UNI LEFT INC AXILLA, MM DIAG BREAST TOMO BILATERAL  Family history of breast cancer - Plan: US BREAST LTD UNI RIGHT INC AXILLA, US BREAST LTD UNI LEFT INC AXILLA, MM DIAG BREAST TOMO BILATERAL, Integrated BRACAnalysis ([place]); MyRisk testing discussed and done  today. Will call with results.   Mass of upper outer quadrant of right breast - Plan: US BREAST LTD UNI RIGHT INC AXILLA, US BREAST LTD UNI LEFT INC AXILLA, MM DIAG BREAST TOMO BILATERAL; pt to call to [facility] dx mammo and u/s. Will f/u with results. If neg, most likely prominent tissue  GYN counsel adequate intake of calcium and vitamin D, diet and exercise     F/U  Return in about 1 year (around 03/29/2022).  Sarely Stracener B. Kirin Pastorino, PA-C 03/29/2021 3:07 PM

## 2021-03-29 ENCOUNTER — Ambulatory Visit (INDEPENDENT_AMBULATORY_CARE_PROVIDER_SITE_OTHER): Payer: BC Managed Care – PPO | Admitting: Obstetrics and Gynecology

## 2021-03-29 ENCOUNTER — Encounter: Payer: Self-pay | Admitting: Obstetrics and Gynecology

## 2021-03-29 ENCOUNTER — Other Ambulatory Visit: Payer: Self-pay

## 2021-03-29 ENCOUNTER — Other Ambulatory Visit (HOSPITAL_COMMUNITY)
Admission: RE | Admit: 2021-03-29 | Discharge: 2021-03-29 | Disposition: A | Discharge status: Home / Self Care | Payer: BC Managed Care – PPO | Source: Ambulatory Visit | Attending: Obstetrics and Gynecology | Admitting: Obstetrics and Gynecology

## 2021-03-29 VITALS — BP 114/60 | Ht 65.0 in | Wt 249.0 lb

## 2021-03-29 DIAGNOSIS — Z01419 Encounter for gynecological examination (general) (routine) without abnormal findings: Secondary | ICD-10-CM

## 2021-03-29 DIAGNOSIS — Z803 Family history of malignant neoplasm of breast: Secondary | ICD-10-CM | POA: Diagnosis not present

## 2021-03-29 DIAGNOSIS — Z124 Encounter for screening for malignant neoplasm of cervix: Secondary | ICD-10-CM | POA: Insufficient documentation

## 2021-03-29 DIAGNOSIS — Z1151 Encounter for screening for human papillomavirus (HPV): Secondary | ICD-10-CM

## 2021-03-29 DIAGNOSIS — Z30431 Encounter for routine checking of intrauterine contraceptive device: Secondary | ICD-10-CM | POA: Diagnosis not present

## 2021-03-29 DIAGNOSIS — Z1231 Encounter for screening mammogram for malignant neoplasm of breast: Secondary | ICD-10-CM

## 2021-03-29 DIAGNOSIS — N6311 Unspecified lump in the right breast, upper outer quadrant: Secondary | ICD-10-CM

## 2021-03-29 NOTE — Patient Instructions (Signed)
I value your feedback and you entrusting us with your care. If you get a [place] patient survey, I would appreciate you taking the time to let us know about your experience today. Thank you! ? ? ?

## 2021-03-31 LAB — CYTOLOGY - PAP
Comment: NEGATIVE
Diagnosis: NEGATIVE
High risk HPV: NEGATIVE

## 2021-04-04 ENCOUNTER — Ambulatory Visit
Admission: RE | Admit: 2021-04-04 | Discharge: 2021-04-04 | Disposition: A | Discharge status: Home / Self Care | Payer: BC Managed Care – PPO | Source: Ambulatory Visit | Attending: Obstetrics and Gynecology | Admitting: Obstetrics and Gynecology

## 2021-04-04 ENCOUNTER — Other Ambulatory Visit: Payer: Self-pay

## 2021-04-04 DIAGNOSIS — Z803 Family history of malignant neoplasm of breast: Secondary | ICD-10-CM | POA: Diagnosis not present

## 2021-04-04 DIAGNOSIS — R922 Inconclusive mammogram: Secondary | ICD-10-CM | POA: Diagnosis not present

## 2021-04-04 DIAGNOSIS — N6489 Other specified disorders of breast: Secondary | ICD-10-CM | POA: Insufficient documentation

## 2021-04-04 DIAGNOSIS — N6311 Unspecified lump in the right breast, upper outer quadrant: Secondary | ICD-10-CM

## 2021-04-04 DIAGNOSIS — Z1231 Encounter for screening mammogram for malignant neoplasm of breast: Secondary | ICD-10-CM | POA: Diagnosis not present

## 2021-04-06 NOTE — Addendum Note (Signed)
Addended by: Ardeth Perfect B on: [id number] 10:26 AM   Modules accepted: Orders

## 2021-04-18 ENCOUNTER — Encounter: Payer: Self-pay | Admitting: Obstetrics and Gynecology

## 2021-04-18 ENCOUNTER — Telehealth: Payer: Self-pay | Admitting: Obstetrics and Gynecology

## 2021-04-18 DIAGNOSIS — Z803 Family history of malignant neoplasm of breast: Secondary | ICD-10-CM

## 2021-04-18 DIAGNOSIS — Z1239 Encounter for other screening for malignant neoplasm of breast: Secondary | ICD-10-CM

## 2021-04-18 DIAGNOSIS — Z9189 Other specified personal risk factors, not elsewhere classified: Secondary | ICD-10-CM

## 2021-04-18 NOTE — Telephone Encounter (Signed)
Pt aware of neg My Risk results. IBIS=18.1%/riskscore=28.6%. Aware of recommendations of monthly SBE, yearly CBE and mammos (done 1/23), and screening breast MRI. Pt would like MRI at [facility].   Patient understands these results only apply to her and her children, and this is not indicative of genetic testing results of her other family members. It is recommended that her other family members have genetic testing done.  Pt also understands negative genetic testing doesn't mean she will never get any of these cancers.   Hard copy mailed to pt. F/u prn.

## 2021-07-03 DIAGNOSIS — J8283 Eosinophilic asthma: Secondary | ICD-10-CM | POA: Diagnosis not present

## 2021-07-03 DIAGNOSIS — J4540 Moderate persistent asthma, uncomplicated: Secondary | ICD-10-CM | POA: Diagnosis not present

## 2021-09-05 ENCOUNTER — Encounter: Payer: Self-pay | Admitting: Obstetrics and Gynecology

## 2021-09-05 DIAGNOSIS — Z803 Family history of malignant neoplasm of breast: Secondary | ICD-10-CM | POA: Diagnosis not present

## 2021-09-07 ENCOUNTER — Encounter: Payer: Self-pay | Admitting: Obstetrics and Gynecology

## 2021-10-03 ENCOUNTER — Ambulatory Visit
Admission: RE | Admit: 2021-10-03 | Discharge: 2021-10-03 | Disposition: A | Discharge status: Home / Self Care | Payer: BC Managed Care – PPO | Source: Ambulatory Visit | Attending: Obstetrics and Gynecology | Admitting: Obstetrics and Gynecology

## 2021-10-03 DIAGNOSIS — N6311 Unspecified lump in the right breast, upper outer quadrant: Secondary | ICD-10-CM

## 2021-10-03 DIAGNOSIS — N6489 Other specified disorders of breast: Secondary | ICD-10-CM | POA: Diagnosis not present

## 2021-10-05 ENCOUNTER — Encounter: Payer: Self-pay | Admitting: Obstetrics and Gynecology

## 2021-10-31 DIAGNOSIS — Z13228 Encounter for screening for other metabolic disorders: Secondary | ICD-10-CM | POA: Diagnosis not present

## 2021-10-31 DIAGNOSIS — Z1329 Encounter for screening for other suspected endocrine disorder: Secondary | ICD-10-CM | POA: Diagnosis not present

## 2021-10-31 DIAGNOSIS — Z1321 Encounter for screening for nutritional disorder: Secondary | ICD-10-CM | POA: Diagnosis not present

## 2021-10-31 DIAGNOSIS — Z1331 Encounter for screening for depression: Secondary | ICD-10-CM | POA: Diagnosis not present

## 2021-10-31 DIAGNOSIS — E7800 Pure hypercholesterolemia, unspecified: Secondary | ICD-10-CM | POA: Diagnosis not present

## 2021-10-31 DIAGNOSIS — Z130 Encounter for screening for diseases of the blood and blood-forming organs and certain disorders involving the immune mechanism: Secondary | ICD-10-CM | POA: Diagnosis not present

## 2021-10-31 DIAGNOSIS — E119 Type 2 diabetes mellitus without complications: Secondary | ICD-10-CM | POA: Diagnosis not present

## 2021-10-31 DIAGNOSIS — Z0000 Encounter for general adult medical examination without abnormal findings: Secondary | ICD-10-CM | POA: Diagnosis not present

## 2023-02-21 NOTE — Patient Instructions (Signed)
Preventing Sexually Transmitted Infections, Adult Sexually transmitted infections (STIs) are spread from person to person (are contagious). They are spread, or transmitted, during sex. The sex may be vaginal, anal, or oral. STIs can be passed during sexual contact with skin, genitals, mouth, or rectum. They may spread through body fluids, such as saliva, semen, blood, vaginal mucus, and urine. STIs are very common. They can happen in people of all ages. Some common STIs are: Herpes. Hepatitis B. Chlamydia. Gonorrhea. Syphilis. Trichomoniasis. Human papillomavirus (HPV). Human immunodeficiency virus (HIV). This can cause acquired immunodeficiency syndrome (AIDS). How can STIs affect me? You may not have symptoms with an STI. Even if you do not have symptoms, you can still spread the infection to others. You also still need treatment. STIs can be treated. Some STIs can be cured. Other STIs cannot be cured and will affect you for the rest of your life. Certain STIs may: Require you to take medicine for the rest of your life. Affect your ability to have children. Increase your risk for getting other STIs. Increase your risk of getting certain conditions. These may include: Cervical cancer. Pelvic inflammatory disease (PID). Organ damage or damage to other parts of your body. This can happen if the infection spreads. Cause problems during pregnancy. STIs may be spread to the baby during pregnancy or birth. Females tend to have more severe problems from STIs than males. What can increase my risk? You may be more at risk for an STI if: You do not use protection during sex. You have more than one sex partner. You have a sex partner who has other sex partners. You have sex with a person who has an STI. You have an STI, or you have had an STI before. You inject drugs or have a sex partner who injects drugs. What actions can I take to prevent STIs? The only way to fully prevent STIs is not to  have sex of any kind. This is called practicing abstinence. If you are sexually active, you can protect yourself and others by taking these actions to lower your risk of getting an STI: Lifestyle Have only one sex partner or limit the number of sex partners you have. Avoid having sex after you have alcohol or drugs. Alcohol and drugs can affect your ability to make good choices. This can lead to risky sexual behaviors. Go to prevention counseling. This can teach you how to avoid getting an STI. Barrier protection  Use methods to stop body fluids from being exchanged between partners during sex (barrier protection). These methods can be used during oral, vaginal, or anal sex. They include: External condom, for males. Internal condom, for females. Dental dam. Use a new barrier method for every sex act from start to finish. Know that a barrier method may not protect you from all STIs. Some STIs, such as herpes, are spread through skin-to-skin contact. Avoid all sexual contact if you or a partner has herpes and there is an active flare with open sores. Birth control pills, injections, implants, and intrauterine devices (IUDs) do not protect against STIs. To prevent both STIs and pregnancy, always use a condom with a second form of birth control. General information Ask your health care provider about taking pre-exposure prophylaxis (PrEP) to prevent HIV. Stay up to date on your vaccines. Some vaccines can lower your risk of getting certain STIs. These include: Hepatitis B vaccine. HPV vaccine. This is recommended for people up to age 26. Get tested for STIs. Have your   partners get tested, too. If you test positive for an STI, follow recommendations from your health care provider about treatment. Make sure your sex partners are tested and treated as well. Where to find more information Learn more about STIs from: Centers for Disease Control and Prevention (CDC): More information about certain  STIs: cdc.gov Places to get sexual health counseling and treatment for free or at a low cost: gettested.cdc.gov U.S. Department of Health and Human Services (HHS): womenshealth.gov This information is not intended to replace advice given to you by your health care provider. Make sure you discuss any questions you have with your health care provider. Document Revised: 03/08/2022 Document Reviewed: 08/11/2021 Elsevier Patient Education  2024 Elsevier Inc.  

## 2023-02-22 ENCOUNTER — Ambulatory Visit (INDEPENDENT_AMBULATORY_CARE_PROVIDER_SITE_OTHER): Payer: BC Managed Care – PPO

## 2023-02-22 ENCOUNTER — Other Ambulatory Visit (HOSPITAL_COMMUNITY)
Admission: RE | Admit: 2023-02-22 | Discharge: 2023-02-22 | Disposition: A | Discharge status: Home / Self Care | Payer: BC Managed Care – PPO | Source: Ambulatory Visit

## 2023-02-22 VITALS — BP 107/76 | HR 86 | Resp 16 | Ht 65.0 in | Wt 225.3 lb

## 2023-02-22 DIAGNOSIS — Z113 Encounter for screening for infections with a predominantly sexual mode of transmission: Secondary | ICD-10-CM | POA: Insufficient documentation

## 2023-02-22 NOTE — Progress Notes (Unsigned)
    NURSE VISIT NOTE  Subjective:    Patient ID: Amanda [name], female    DOB: [birth year], 37 y.o.   MRN: [id number]  HPI  Patient is a 37 y.o. G63P1011 female who presents for std screening. She has a new partner and would like to be screened. Denies abnormal vaginal bleeding or significant pelvic pain or fever. She also is being treated for UTI symptoms. She is taking Macrobid as prescribed. Patient admits to history of known exposure to STD in the past.   Objective:    BP 107/76   Pulse 86   Resp 16   Ht 5\' 5"  (1.651 m)   Wt 225 lb 4.8 oz (102.2 kg)   BMI 37.49 kg/m    @THIS  VISIT ONLY@  Assessment:   1. Screen for STD (sexually transmitted disease)     rule out GC or chlamydia  Plan:   GC and chlamydia DNA  probe sent to lab. HIV, RPR, and HEP B, C Treatment: Patient will be treated after results. ROV prn if symptoms persist or worsen.     Santiago Bumpers, CMA [facility]

## 2023-02-23 LAB — HEPATITIS B SURFACE ANTIGEN: Hepatitis B Surface Ag: NEGATIVE

## 2023-02-23 LAB — HEPATITIS C ANTIBODY: Hep C Virus Ab: NONREACTIVE

## 2023-02-23 LAB — RPR: RPR Ser Ql: NONREACTIVE

## 2023-02-23 LAB — HIV ANTIBODY (ROUTINE TESTING W REFLEX): HIV Screen 4th Generation wRfx: NONREACTIVE

## 2023-02-25 LAB — CERVICOVAGINAL ANCILLARY ONLY
Bacterial Vaginitis (gardnerella): NEGATIVE
Candida Glabrata: NEGATIVE
Candida Vaginitis: NEGATIVE
Chlamydia: NEGATIVE
Comment: NEGATIVE
Comment: NEGATIVE
Comment: NEGATIVE
Comment: NEGATIVE
Comment: NEGATIVE
Comment: NORMAL
Neisseria Gonorrhea: NEGATIVE
Trichomonas: NEGATIVE

## 2023-03-19 ENCOUNTER — Ambulatory Visit: Payer: BC Managed Care – PPO | Admitting: Obstetrics and Gynecology

## 2023-06-04 IMAGING — US US BREAST*R* LIMITED INC AXILLA
2 series · 4 of 4 positions shown · non-contrast
Comparison: None.

CLINICAL DATA: Palpable area in the RIGHT breast. Strong family
history of breast cancer.

EXAM:
DIGITAL DIAGNOSTIC BILATERAL MAMMOGRAM WITH TOMOSYNTHESIS AND CAD;
ULTRASOUND RIGHT BREAST LIMITED
TECHNIQUE: Bilateral digital diagnostic mammography and breast tomosynthesis
was performed. The images were evaluated with computer-aided
detection.; Targeted ultrasound examination of the right breast was
performed

[Series 1: us breast*right* limited inc axilla · 0.07mm/px · 2 of 2 slices shown (1 of 2)]
[im 1/2]
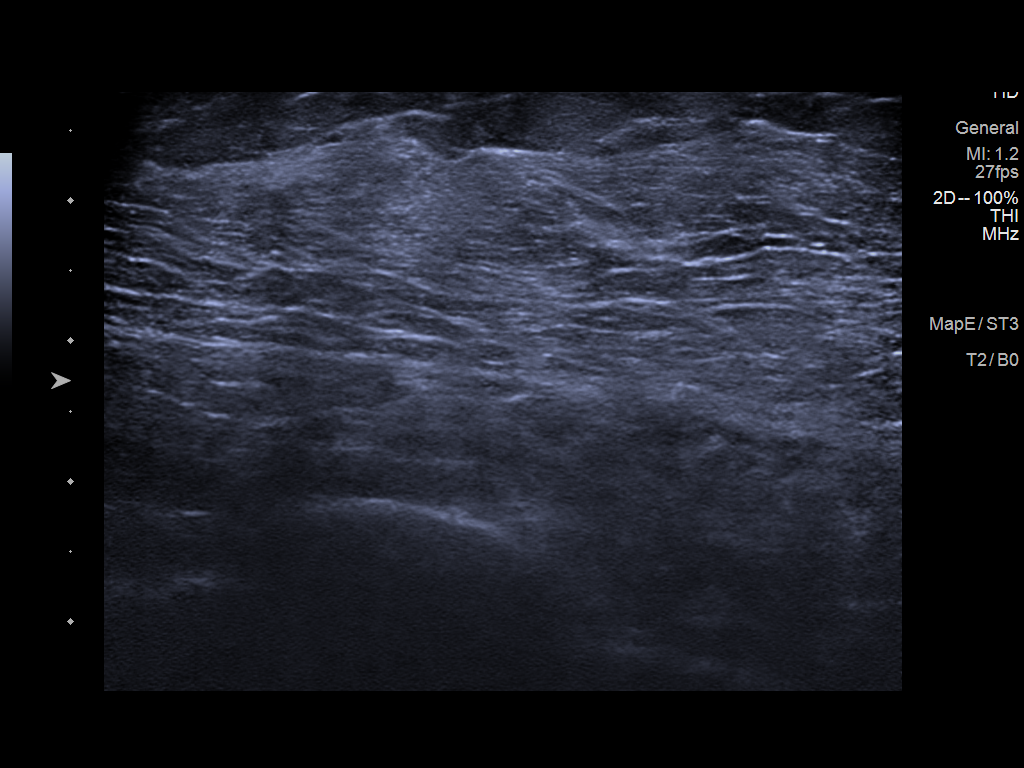
[im 2/2]
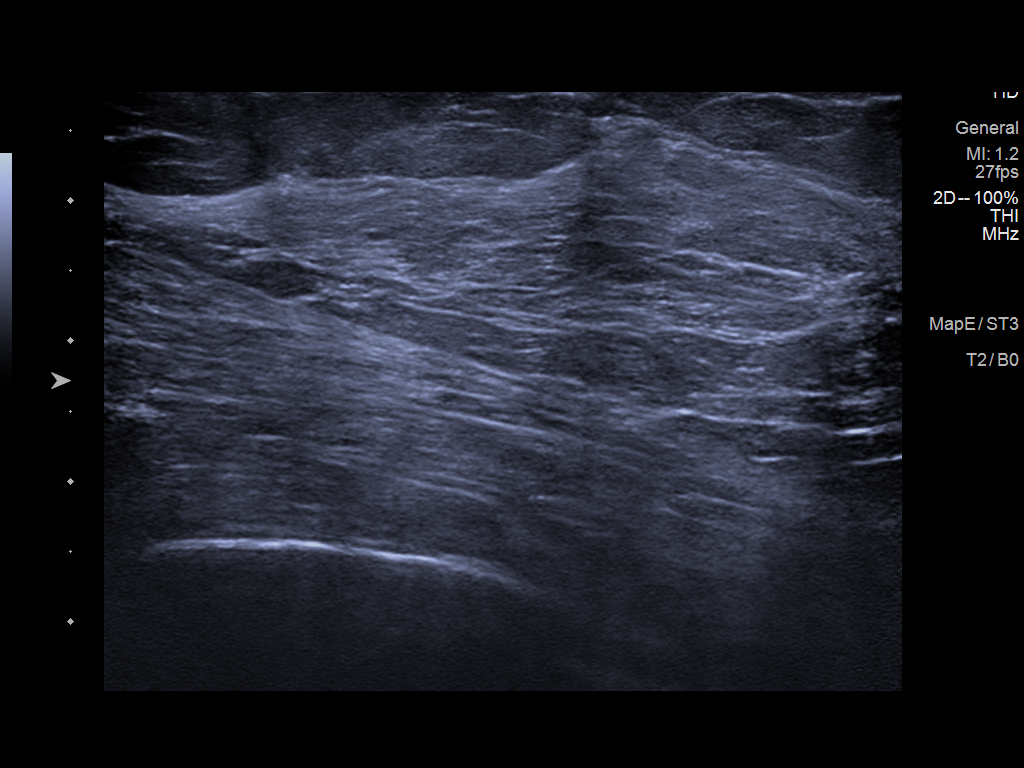

[Series 2: us breast*right* limited inc axilla · 0.07mm/px · 2 of 2 slices shown (2 of 2)]
[im 1/2]
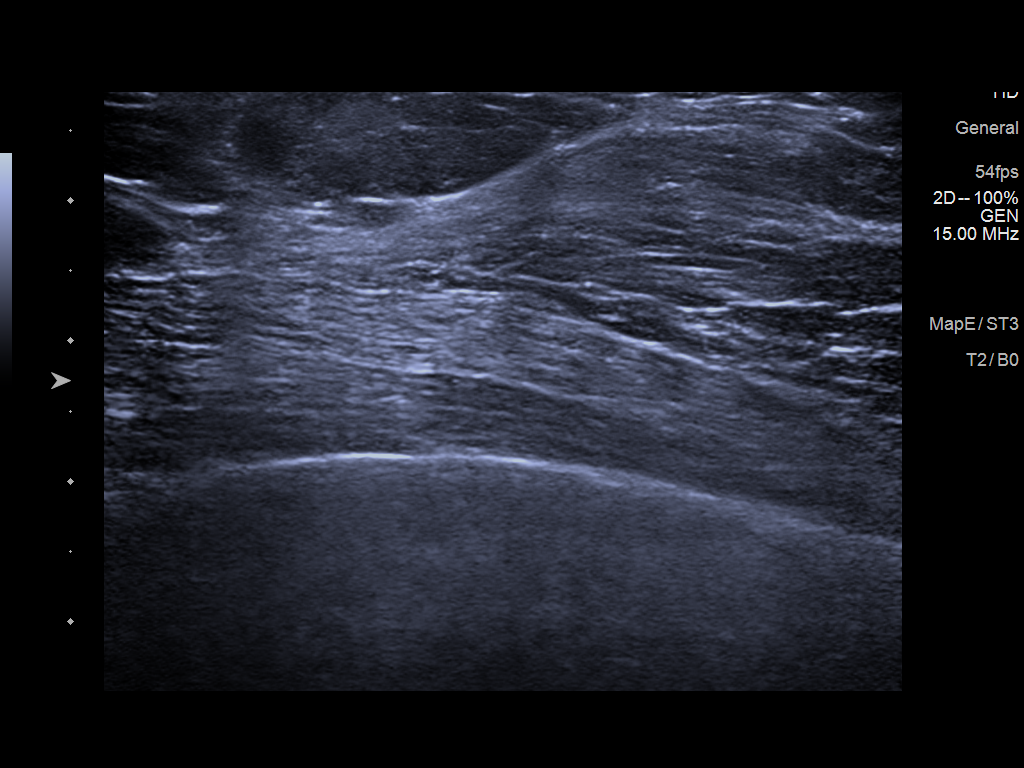
[im 2/2]
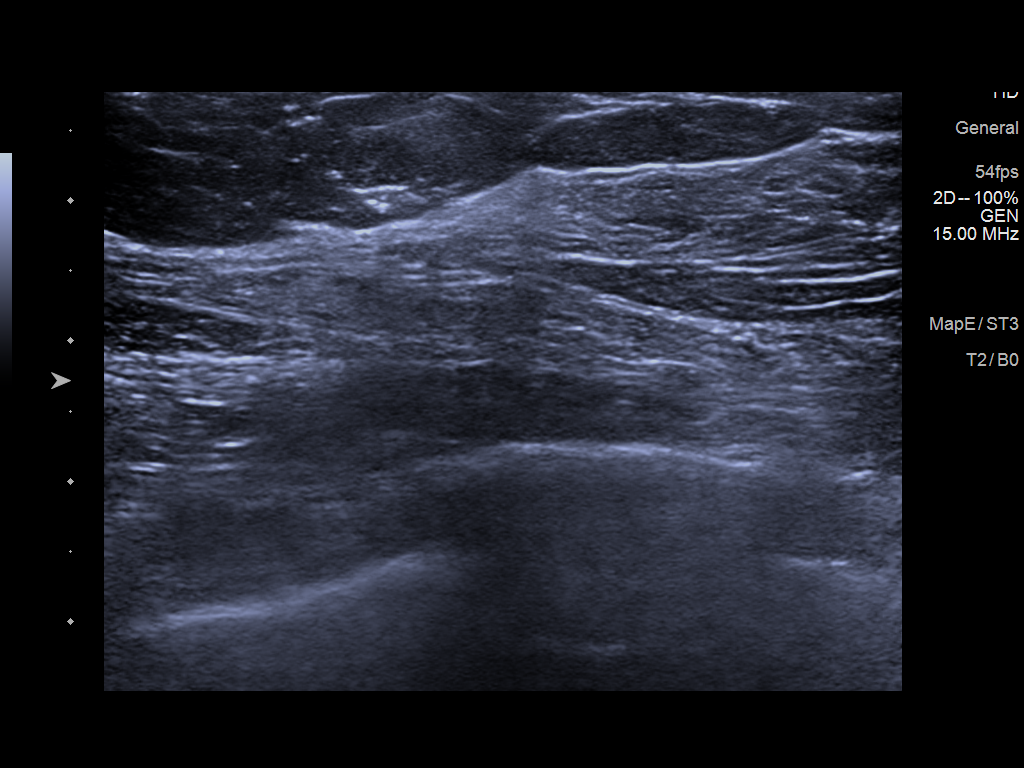

[4 of 4 positions shown; findings below may reference images not displayed]

Baseline

ACR Breast Density Category b: There are scattered areas of
fibroglandular density.
FINDINGS: Spot compression tomosynthesis views were obtained over the palpable
area of concern in the RIGHT breast. No suspicious mammographic
finding is identified in this area. Fat density is noted subjacent
to the site of palpable concern on tangential views. In the RIGHT
upper outer breast at posterior depth, there is a focal asymmetry.
Spot compression tomosynthesis views demonstrate interspersed fat,
most consistent with baseline configuration of fibroglandular
tissue. No suspicious mass, distortion, or microcalcifications are
identified to suggest presence of malignancy in the LEFT breast.

On physical exam, there is a superficial mobile nodule appreciated
in the RIGHT upper outer breast at the site of palpable concern.

Targeted RIGHT breast ultrasound was performed in the palpable area
of concern at the upper outer breast. No suspicious solid or cystic
mass is identified. There is an echogenic fat lobule noted at 10
o'clock 13 cm from the nipple at the site of palpable concern which
may correspond to the palpable area appreciated on physical exam.
This is most consistent with an inflamed fat lobule versus benign
lipoma. No discrete capsule is appreciated mammographically or
sonographically.

Targeted ultrasound was performed of the RIGHT upper outer breast.
No suspicious cystic or solid mass is seen at the site of focal
asymmetry concern. An island of benign appearing fibroglandular
tissue is noted.
IMPRESSION: 1. No mammographic or sonographic evidence of malignancy at the site
of palpable concern in the RIGHT upper outer breast. There is a
likely inflamed fat lobule appreciated on ultrasound at the site of
palpable concern. Any further workup of the patient's symptoms
should be based on the clinical assessment.
2. There is a probably benign focal asymmetry in the RIGHT upper
outer breast at posterior depth which likely reflects patient's
baseline configuration of fibroglandular tissue. Recommend follow-up
diagnostic mammogram with ultrasound if deemed necessary in 6
months.
3. No mammographic evidence of malignancy in the LEFT breast.

RECOMMENDATION:
1. RIGHT diagnostic mammogram with ultrasound if deemed necessary in
6 months.
2. Recommend evaluation for candidacy for high risk screening
protocol given strong family history of breast cancer. The American
Cancer Society recommends annual MRI and mammography in patients
with an estimated lifetime risk of developing breast cancer greater
than 20 - 25%, or who are known or suspected to be positive for the
breast cancer gene.

I have discussed the findings and recommendations with the patient.
If applicable, a reminder letter will be sent to the patient
regarding the next appointment.

BI-RADS CATEGORY  3: Probably benign.

## 2023-06-04 IMAGING — MG DIGITAL DIAGNOSTIC BILAT W/ TOMO W/ CAD
8 of 15 series · 8 of 40 positions shown · non-contrast
Comparison: None.

CLINICAL DATA: Palpable area in the RIGHT breast. Strong family
history of breast cancer.

EXAM:
DIGITAL DIAGNOSTIC BILATERAL MAMMOGRAM WITH TOMOSYNTHESIS AND CAD;
ULTRASOUND RIGHT BREAST LIMITED
TECHNIQUE: Bilateral digital diagnostic mammography and breast tomosynthesis
was performed. The images were evaluated with computer-aided
detection.; Targeted ultrasound examination of the right breast was
performed

[R CC synth-2D (1 of 2)]
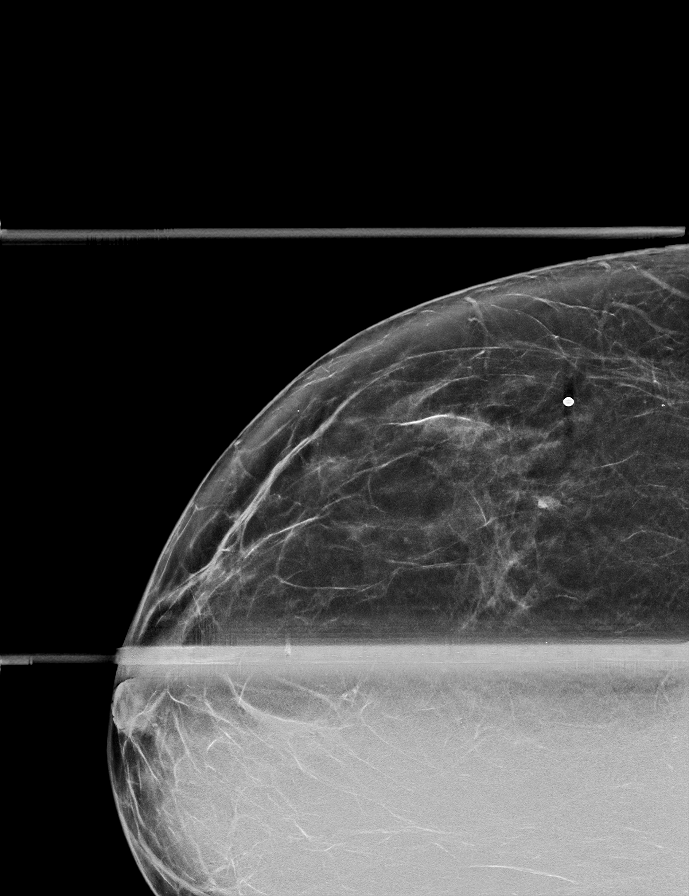

[R MLO synth-2D (1 of 2)]
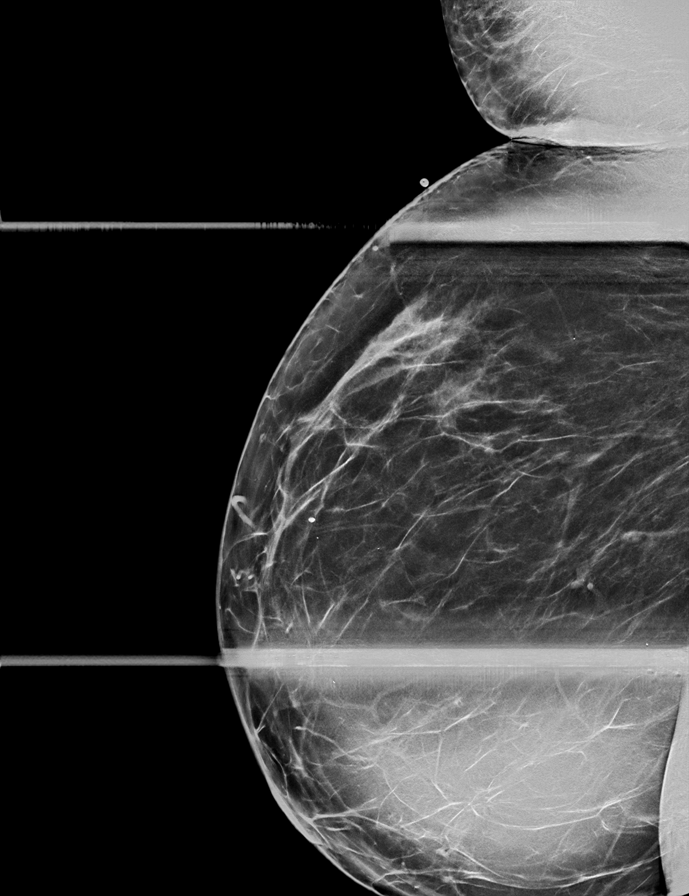

[R ML synth-2D]
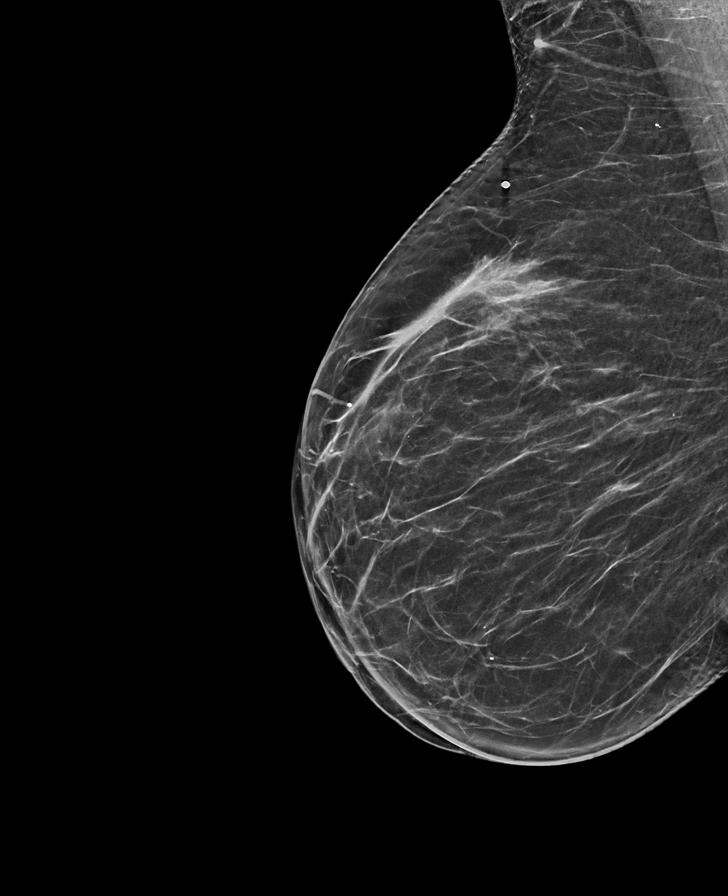

[R TAN synth-2D]
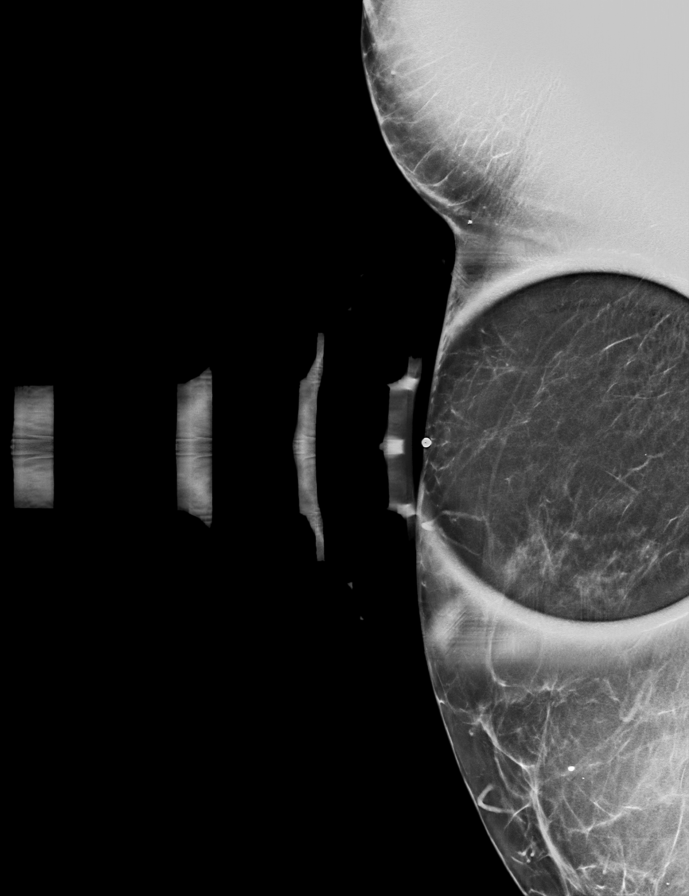

[R CC synth-2D (2 of 2)]
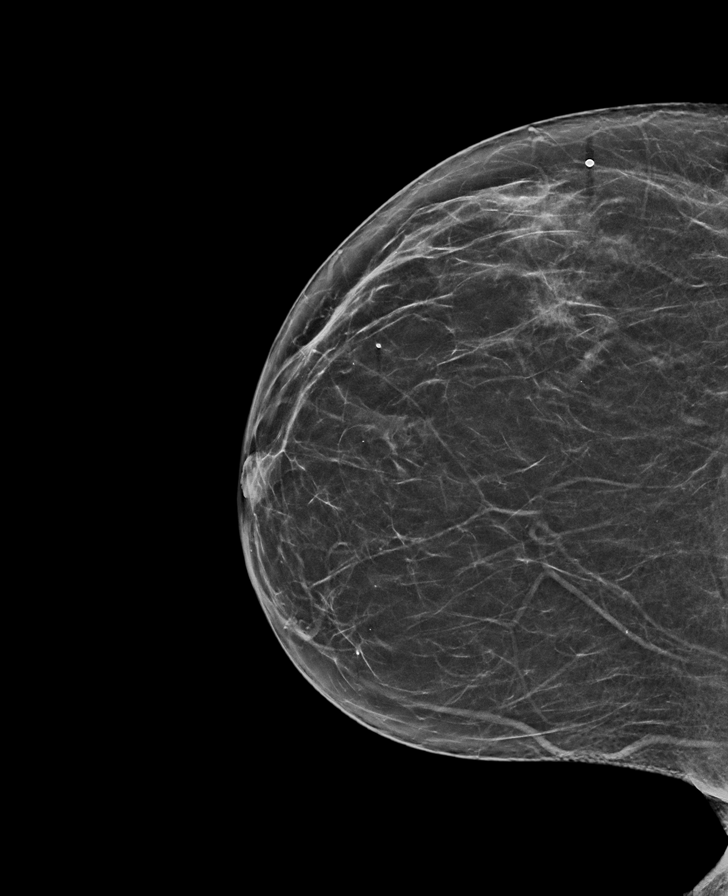

[R MLO synth-2D (2 of 2)]
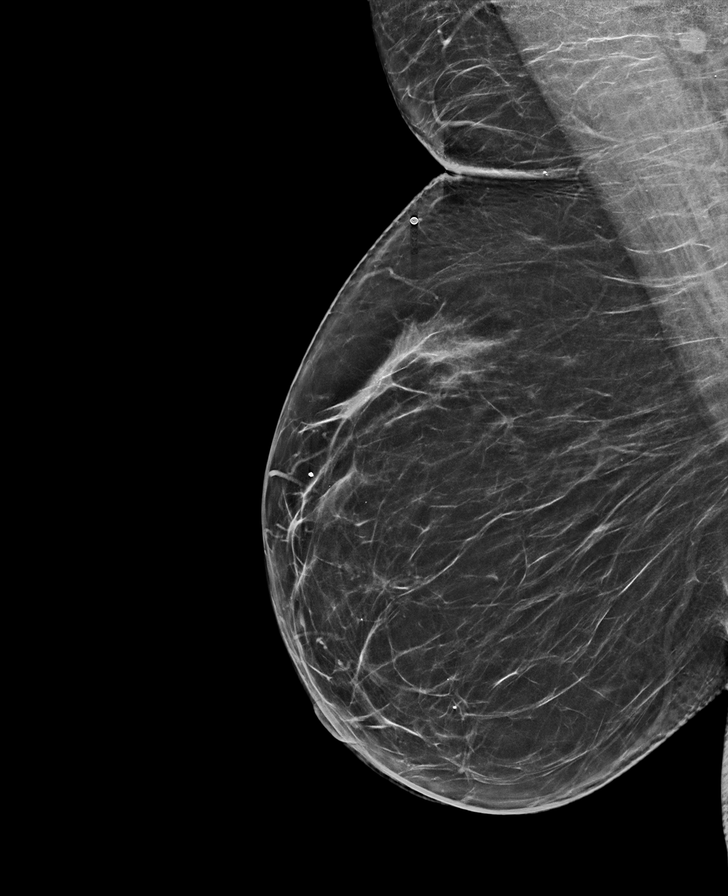

[L CC synth-2D]
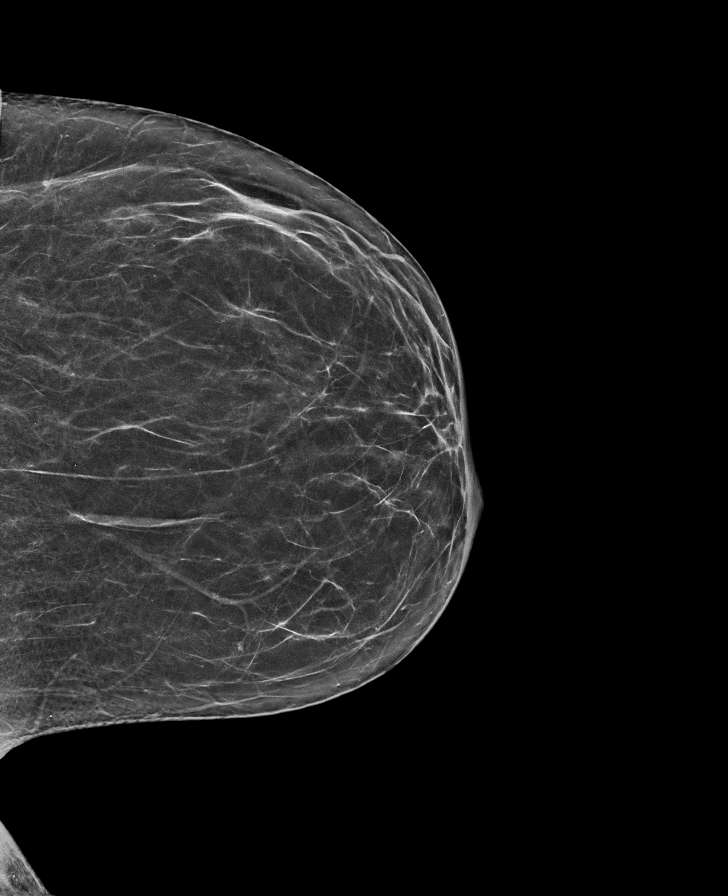

[R CC tomo · tomo slice 39/57.0]
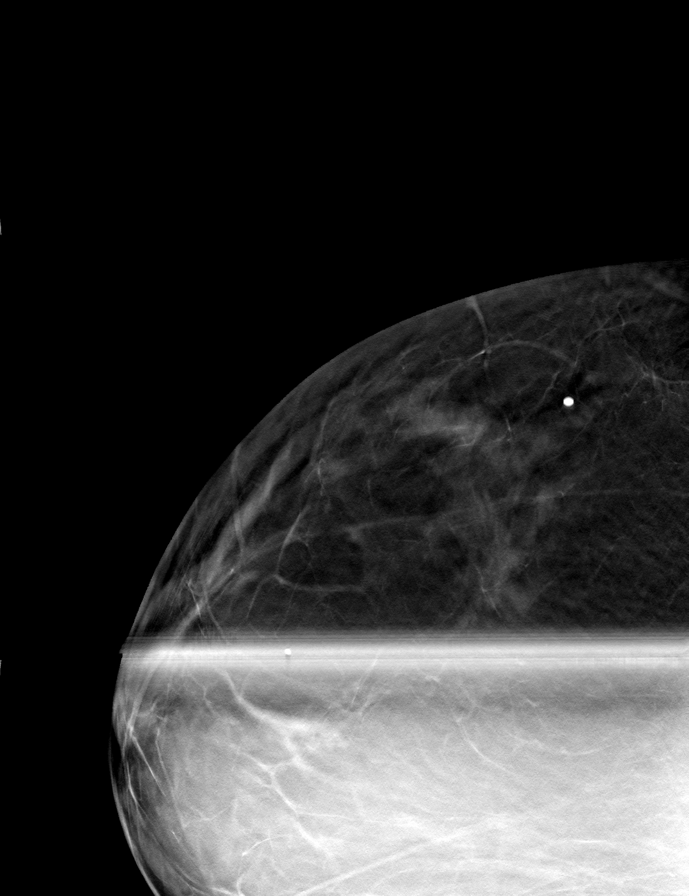

[8 of 40 positions shown; findings below may reference images not displayed]

Baseline

ACR Breast Density Category b: There are scattered areas of
fibroglandular density.
FINDINGS: Spot compression tomosynthesis views were obtained over the palpable
area of concern in the RIGHT breast. No suspicious mammographic
finding is identified in this area. Fat density is noted subjacent
to the site of palpable concern on tangential views. In the RIGHT
upper outer breast at posterior depth, there is a focal asymmetry.
Spot compression tomosynthesis views demonstrate interspersed fat,
most consistent with baseline configuration of fibroglandular
tissue. No suspicious mass, distortion, or microcalcifications are
identified to suggest presence of malignancy in the LEFT breast.

On physical exam, there is a superficial mobile nodule appreciated
in the RIGHT upper outer breast at the site of palpable concern.

Targeted RIGHT breast ultrasound was performed in the palpable area
of concern at the upper outer breast. No suspicious solid or cystic
mass is identified. There is an echogenic fat lobule noted at 10
o'clock 13 cm from the nipple at the site of palpable concern which
may correspond to the palpable area appreciated on physical exam.
This is most consistent with an inflamed fat lobule versus benign
lipoma. No discrete capsule is appreciated mammographically or
sonographically.

Targeted ultrasound was performed of the RIGHT upper outer breast.
No suspicious cystic or solid mass is seen at the site of focal
asymmetry concern. An island of benign appearing fibroglandular
tissue is noted.
IMPRESSION: 1. No mammographic or sonographic evidence of malignancy at the site
of palpable concern in the RIGHT upper outer breast. There is a
likely inflamed fat lobule appreciated on ultrasound at the site of
palpable concern. Any further workup of the patient's symptoms
should be based on the clinical assessment.
2. There is a probably benign focal asymmetry in the RIGHT upper
outer breast at posterior depth which likely reflects patient's
baseline configuration of fibroglandular tissue. Recommend follow-up
diagnostic mammogram with ultrasound if deemed necessary in 6
months.
3. No mammographic evidence of malignancy in the LEFT breast.

RECOMMENDATION:
1. RIGHT diagnostic mammogram with ultrasound if deemed necessary in
6 months.
2. Recommend evaluation for candidacy for high risk screening
protocol given strong family history of breast cancer. The American
Cancer Society recommends annual MRI and mammography in patients
with an estimated lifetime risk of developing breast cancer greater
than 20 - 25%, or who are known or suspected to be positive for the
breast cancer gene.

I have discussed the findings and recommendations with the patient.
If applicable, a reminder letter will be sent to the patient
regarding the next appointment.

BI-RADS CATEGORY  3: Probably benign.

## 2023-06-15 ENCOUNTER — Emergency Department
Admission: EM | Admit: 2023-06-15 | Discharge: 2023-06-15 | Disposition: A | Discharge status: Home / Self Care | Attending: Emergency Medicine | Admitting: Emergency Medicine

## 2023-06-15 ENCOUNTER — Emergency Department

## 2023-06-15 ENCOUNTER — Other Ambulatory Visit: Payer: Self-pay

## 2023-06-15 DIAGNOSIS — J4521 Mild intermittent asthma with (acute) exacerbation: Secondary | ICD-10-CM | POA: Diagnosis not present

## 2023-06-15 DIAGNOSIS — R0602 Shortness of breath: Secondary | ICD-10-CM | POA: Diagnosis present

## 2023-06-15 LAB — COMPREHENSIVE METABOLIC PANEL WITH GFR
ALT: 10 U/L (ref 0–44)
AST: 13 U/L — ABNORMAL LOW (ref 15–41)
Albumin: 3.5 g/dL (ref 3.5–5.0)
Alkaline Phosphatase: 85 U/L (ref 38–126)
Anion gap: 9 (ref 5–15)
BUN: 12 mg/dL (ref 6–20)
CO2: 18 mmol/L — ABNORMAL LOW (ref 22–32)
Calcium: 8.6 mg/dL — ABNORMAL LOW (ref 8.9–10.3)
Chloride: 111 mmol/L (ref 98–111)
Creatinine, Ser: 0.85 mg/dL (ref 0.44–1.00)
GFR, Estimated: >60 mL/min (ref >60)
Glucose, Bld: 133 mg/dL — ABNORMAL HIGH (ref 70–99)
Potassium: 3.7 mmol/L (ref 3.5–5.1)
Sodium: 138 mmol/L (ref 135–145)
Total Bilirubin: 0.5 mg/dL (ref 0.0–1.2)
Total Protein: 7.2 g/dL (ref 6.5–8.1)

## 2023-06-15 LAB — CBC WITH DIFFERENTIAL/PLATELET
Abs Immature Granulocytes: 0.03 10*3/uL (ref 0.00–0.07)
Basophils Absolute: 0.1 10*3/uL (ref 0.0–0.1)
Basophils Relative: 1 %
Eosinophils Absolute: 1.1 10*3/uL — ABNORMAL HIGH (ref 0.0–0.5)
Eosinophils Relative: 11 %
HCT: 38.8 % (ref 36.0–46.0)
Hemoglobin: 12.6 g/dL (ref 12.0–15.0)
Immature Granulocytes: 0 %
Lymphocytes Relative: 29 %
Lymphs Abs: 3 10*3/uL (ref 0.7–4.0)
MCH: 28.8 pg (ref 26.0–34.0)
MCHC: 32.5 g/dL (ref 30.0–36.0)
MCV: 88.6 fL (ref 80.0–100.0)
Monocytes Absolute: 0.7 10*3/uL (ref 0.1–1.0)
Monocytes Relative: 7 %
Neutro Abs: 5.3 10*3/uL (ref 1.7–7.7)
Neutrophils Relative %: 52 %
Platelets: 325 10*3/uL (ref 150–400)
RBC: 4.38 MIL/uL (ref 3.87–5.11)
RDW: 13 % (ref 11.5–15.5)
WBC: 10.1 10*3/uL (ref 4.0–10.5)
nRBC: 0 % (ref 0.0–0.2)

## 2023-06-15 LAB — TROPONIN I (HIGH SENSITIVITY): Troponin I (High Sensitivity): <2 ng/L (ref <18)

## 2023-06-15 MED ORDER — PREDNISONE 20 MG PO TABS
60.0000 mg | ORAL_TABLET | Freq: Once | ORAL | Status: AC
  Administered 2023-06-15: 60 mg via ORAL
  Filled 2023-06-15: qty 3

## 2023-06-15 MED ORDER — PREDNISONE 50 MG PO TABS: 50.0000 mg | ORAL_TABLET | Freq: Every day | ORAL | 0 refills | Status: AC

## 2023-06-15 MED ORDER — IPRATROPIUM-ALBUTEROL 0.5-2.5 (3) MG/3ML IN SOLN
6.0000 mL | Freq: Once | RESPIRATORY_TRACT | Status: AC
  Administered 2023-06-15: 6 mL via RESPIRATORY_TRACT
  Filled 2023-06-15: qty 6

## 2023-06-15 NOTE — ED Triage Notes (Addendum)
 Pt c/o shob/asthma aggravated by pollen needing inhaler q4hrs. Pt states she's been hospitalized in the past for similar s/s. Pt is alert, speaking in full sentences, DOE, lung sounds clear bilaterally.

## 2023-06-15 NOTE — ED Provider Notes (Signed)
 [facility] Provider Note    Event Date/Time   First MD Initiated Contact with Patient 06/15/23 2007     (approximate)   History   Shortness of Breath   HPI  Amanda [name] is a 39 y.o. female past medical history significant for asthma, nicotine use, who presents to the emergency department with shortness of breath.  States that she has a history of allergy induced asthma.  States that since she had worsening pollen she has been coughing and having worsening asthma exacerbation.  Denies any chest pain.  No fever or chills.  States that she missed her appointment with pulmonology last year so she has not been on her trilogy.  Denies any history of DVT or PE.  Not on an OCP.     Physical Exam   Triage Vital Signs: ED Triage Vitals  Encounter Vitals Group     BP 06/15/23 1933 (!) 152/92     Systolic BP Percentile --      Diastolic BP Percentile --      Pulse Rate 06/15/23 1933 (!) 103     Resp 06/15/23 1933 (!) 22     Temp 06/15/23 1933 98.3 F (36.8 C)     Temp Source 06/15/23 1933 Oral     SpO2 06/15/23 1933 99 %     Weight --      Height --      Head Circumference --      Peak Flow --      Pain Score 06/15/23 1934 0     Pain Loc --      Pain Education --      Exclude from Growth Chart --     Most recent vital signs: Vitals:   06/15/23 2040 06/15/23 2130  BP:    Pulse: 99 97  Resp: 18 12  Temp:    SpO2: 97% 100%    Physical Exam Constitutional:      Appearance: She is well-developed.  HENT:     Head: Atraumatic.  Eyes:     Conjunctiva/sclera: Conjunctivae normal.  Cardiovascular:     Rate and Rhythm: Regular rhythm.  Pulmonary:     Effort: Tachypnea present. No respiratory distress.     Breath sounds: Wheezing present.  Abdominal:     General: There is no distension.  Musculoskeletal:        General: Normal range of motion.     Cervical back: Normal range of motion.     Right lower leg: No edema.     Left lower leg: No  edema.  Skin:    General: Skin is warm.     Capillary Refill: Capillary refill takes less than 2 seconds.  Neurological:     Mental Status: She is alert. Mental status is at baseline.     IMPRESSION / MDM / ASSESSMENT AND PLAN / ED COURSE  I reviewed the triage vital signs and the nursing notes.  Differential diagnosis including asthma exacerbation, viral illness including COVID/influenza, pneumonia, ACS.  Mildly tachycardic on arrival, have a very low suspicion for pulmonary embolism given her wheezing on exam.  Low risk Wells criteria.  Do not feel that a D-dimer is necessary at this time.  EKG  I, Corena Herter, the attending physician, personally viewed and interpreted this ECG. sinus tachycardia with a heart rate of 101.  Normal intervals.  No chamber enlargement.  No significant ST elevation or depression.  No findings of acute ischemia or dysrhythmia.  No tachycardic or bradycardic dysrhythmias while on cardiac telemetry.  RADIOLOGY I independently reviewed imaging, my interpretation of imaging: Chest x-ray no signs of pneumonia  LABS (all labs ordered are listed, but only abnormal results are displayed) Labs interpreted as -    Labs Reviewed  CBC WITH DIFFERENTIAL/PLATELET - Abnormal; Notable for the following components:      Result Value   Eosinophils Absolute 1.1 (*)    All other components within normal limits  COMPREHENSIVE METABOLIC PANEL WITH GFR - Abnormal; Notable for the following components:   CO2 18 (*)    Glucose, Bld 133 (*)    Calcium 8.6 (*)    AST 13 (*)    All other components within normal limits  TROPONIN I (HIGH SENSITIVITY)     MDM  Given DuoNeb treatment and prednisone.  On reevaluation had significant improvement of symptoms.  Improvement of wheezing.  Talking in full sentences with no signs of respiratory distress.  States she feels much better.  Has albuterol at home and nebulizer treatments.  Will do a short course of steroids.  Will  hold on any antibiotics at this time because most likely allergy induced.  Discussed close follow-up with her primary care physician.  Discussed return precautions for any worsening symptoms.     PROCEDURES:  Critical Care performed: No  Procedures  Patient's presentation is most consistent with exacerbation of chronic illness.   MEDICATIONS ORDERED IN ED: Medications  predniSONE (DELTASONE) tablet 60 mg (60 mg Oral Given 06/15/23 2124)  ipratropium-albuterol (DUONEB) 0.5-2.5 (3) MG/3ML nebulizer solution 6 mL (6 mLs Nebulization Given 06/15/23 2124)    FINAL CLINICAL IMPRESSION(S) / ED DIAGNOSES   Final diagnoses:  Mild intermittent asthma with exacerbation     Rx / DC Orders   ED Discharge Orders          Ordered    predniSONE (DELTASONE) 50 MG tablet  Daily with breakfast        06/15/23 2245             Note:  This document was prepared using Dragon voice recognition software and may include unintentional dictation errors.   Corena Herter, MD 06/15/23 2248

## 2023-06-15 NOTE — Group Note (Deleted)
 Date:  06/15/2023 Time:  9:53 PM  Group Topic/Focus:  Wrap-Up Group:   The focus of this group is to help patients review their daily goal of treatment and discuss progress on daily workbooks.     Participation Level:  {BHH PARTICIPATION WUJWJ:19147}  Participation Quality:  {BHH PARTICIPATION QUALITY:22265}  Affect:  {BHH AFFECT:22266}  Cognitive:  {BHH COGNITIVE:22267}  Insight: {BHH Insight2:20797}  Engagement in Group:  {BHH ENGAGEMENT IN WGNFA:21308}  Modes of Intervention:  {BHH MODES OF INTERVENTION:22269}  Additional Comments:  ***  Maglione,Angell Honse E 06/15/2023, 9:53 PM
